# Patient Record
Sex: Male | Born: 1950 | Race: Black or African American | Hispanic: No | Smoking: Current some day smoker
Health system: Southern US, Community
[De-identification: ages and names within clinical notes are randomized; demographics above are authoritative.]

## PROBLEM LIST (undated history)

## (undated) DIAGNOSIS — G4700 Insomnia, unspecified: Secondary | ICD-10-CM

## (undated) DIAGNOSIS — E1149 Type 2 diabetes mellitus with other diabetic neurological complication: Secondary | ICD-10-CM

## (undated) DIAGNOSIS — I10 Essential (primary) hypertension: Secondary | ICD-10-CM

## (undated) DIAGNOSIS — N400 Enlarged prostate without lower urinary tract symptoms: Secondary | ICD-10-CM

## (undated) DIAGNOSIS — G5793 Unspecified mononeuropathy of bilateral lower limbs: Secondary | ICD-10-CM

## (undated) DIAGNOSIS — E785 Hyperlipidemia, unspecified: Secondary | ICD-10-CM

## (undated) DIAGNOSIS — K219 Gastro-esophageal reflux disease without esophagitis: Secondary | ICD-10-CM

## (undated) DIAGNOSIS — I2510 Atherosclerotic heart disease of native coronary artery without angina pectoris: Secondary | ICD-10-CM

## (undated) DIAGNOSIS — N183 Chronic kidney disease, stage 3 (moderate): Secondary | ICD-10-CM

## (undated) HISTORY — DX: Atherosclerotic heart disease of native coronary artery without angina pectoris: I25.10

## (undated) HISTORY — DX: Insomnia, unspecified: G47.00

## (undated) HISTORY — DX: Essential (primary) hypertension: I10

## (undated) HISTORY — DX: Chronic kidney disease, stage 3 (moderate): N18.3

## (undated) HISTORY — DX: Gastro-esophageal reflux disease without esophagitis: K21.9

## (undated) HISTORY — DX: Type 2 diabetes mellitus with other diabetic neurological complication: E11.49

## (undated) HISTORY — DX: Unspecified mononeuropathy of bilateral lower limbs: G57.93

## (undated) HISTORY — DX: Hyperlipidemia, unspecified: E78.5

## (undated) HISTORY — DX: Benign prostatic hyperplasia without lower urinary tract symptoms: N40.0

---

## 2009-04-18 ENCOUNTER — Encounter: Payer: Self-pay | Admitting: *Deleted

## 2009-04-18 ENCOUNTER — Inpatient Hospital Stay (HOSPITAL_COMMUNITY): Admission: EM | Admit: 2009-04-18 | Discharge: 2009-04-19 | Payer: Self-pay | Admitting: Emergency Medicine

## 2009-04-18 ENCOUNTER — Ambulatory Visit: Payer: Self-pay | Admitting: Internal Medicine

## 2009-04-18 LAB — CONVERTED CEMR LAB: Hgb A1c MFr Bld: 6 %

## 2009-04-19 ENCOUNTER — Encounter: Payer: Self-pay | Admitting: Internal Medicine

## 2009-04-29 DIAGNOSIS — R079 Chest pain, unspecified: Secondary | ICD-10-CM

## 2009-04-29 DIAGNOSIS — I10 Essential (primary) hypertension: Secondary | ICD-10-CM

## 2009-05-03 ENCOUNTER — Ambulatory Visit: Payer: Self-pay | Admitting: Cardiology

## 2009-05-03 ENCOUNTER — Ambulatory Visit: Payer: Self-pay | Admitting: Internal Medicine

## 2009-05-03 ENCOUNTER — Encounter: Payer: Self-pay | Admitting: Cardiology

## 2009-05-03 DIAGNOSIS — E1149 Type 2 diabetes mellitus with other diabetic neurological complication: Secondary | ICD-10-CM | POA: Insufficient documentation

## 2009-05-04 ENCOUNTER — Encounter: Payer: Self-pay | Admitting: Internal Medicine

## 2009-05-10 ENCOUNTER — Telehealth (INDEPENDENT_AMBULATORY_CARE_PROVIDER_SITE_OTHER): Payer: Self-pay | Admitting: Radiology

## 2009-05-11 ENCOUNTER — Encounter: Payer: Self-pay | Admitting: Cardiology

## 2009-05-11 ENCOUNTER — Ambulatory Visit: Payer: Self-pay

## 2009-05-12 ENCOUNTER — Ambulatory Visit: Payer: Self-pay | Admitting: Internal Medicine

## 2009-05-12 DIAGNOSIS — K219 Gastro-esophageal reflux disease without esophagitis: Secondary | ICD-10-CM

## 2009-05-16 ENCOUNTER — Ambulatory Visit: Payer: Self-pay | Admitting: Cardiology

## 2009-05-30 ENCOUNTER — Encounter: Payer: Self-pay | Admitting: *Deleted

## 2009-06-01 ENCOUNTER — Ambulatory Visit: Payer: Self-pay | Admitting: Internal Medicine

## 2009-06-01 DIAGNOSIS — G4700 Insomnia, unspecified: Secondary | ICD-10-CM

## 2009-06-01 LAB — CONVERTED CEMR LAB
Cholesterol: 146 mg/dL
HDL: 43 mg/dL
LDL Cholesterol: 78 mg/dL

## 2009-06-02 DIAGNOSIS — I2510 Atherosclerotic heart disease of native coronary artery without angina pectoris: Secondary | ICD-10-CM

## 2009-06-02 HISTORY — DX: Atherosclerotic heart disease of native coronary artery without angina pectoris: I25.10

## 2009-06-17 ENCOUNTER — Ambulatory Visit: Payer: Self-pay | Admitting: Cardiology

## 2009-06-17 ENCOUNTER — Encounter: Payer: Self-pay | Admitting: Cardiology

## 2009-06-17 ENCOUNTER — Encounter (INDEPENDENT_AMBULATORY_CARE_PROVIDER_SITE_OTHER): Payer: Self-pay | Admitting: *Deleted

## 2009-06-20 LAB — CONVERTED CEMR LAB
BUN: 9 mg/dL (ref 6–23)
Basophils Absolute: 0.1 10*3/uL (ref 0.0–0.1)
CO2: 28 meq/L (ref 19–32)
Eosinophils Absolute: 0.1 10*3/uL (ref 0.0–0.7)
Glucose, Bld: 104 mg/dL — ABNORMAL HIGH (ref 70–99)
HCT: 44.9 % (ref 39.0–52.0)
Hemoglobin: 15.1 g/dL (ref 13.0–17.0)
Lymphs Abs: 2.2 10*3/uL (ref 0.7–4.0)
MCHC: 33.7 g/dL (ref 30.0–36.0)
Monocytes Absolute: 0.6 10*3/uL (ref 0.1–1.0)
Neutro Abs: 5 10*3/uL (ref 1.4–7.7)
Platelets: 547 10*3/uL — ABNORMAL HIGH (ref 150.0–400.0)
Potassium: 4 meq/L (ref 3.5–5.1)
Prothrombin Time: 11.5 s (ref 10.9–13.3)
RDW: 12.6 % (ref 11.5–14.6)
Sodium: 141 meq/L (ref 135–145)

## 2009-06-24 ENCOUNTER — Ambulatory Visit: Payer: Self-pay | Admitting: Cardiovascular Disease

## 2009-06-24 ENCOUNTER — Inpatient Hospital Stay (HOSPITAL_COMMUNITY): Admission: RE | Admit: 2009-06-24 | Discharge: 2009-06-25 | Payer: Self-pay | Admitting: Cardiovascular Disease

## 2009-06-25 ENCOUNTER — Encounter: Payer: Self-pay | Admitting: Cardiovascular Disease

## 2009-07-01 ENCOUNTER — Ambulatory Visit: Payer: Self-pay | Admitting: Cardiology

## 2009-07-13 ENCOUNTER — Encounter: Payer: Self-pay | Admitting: Cardiovascular Disease

## 2009-08-01 ENCOUNTER — Ambulatory Visit: Payer: Self-pay | Admitting: Cardiology

## 2009-08-01 DIAGNOSIS — J449 Chronic obstructive pulmonary disease, unspecified: Secondary | ICD-10-CM | POA: Insufficient documentation

## 2009-08-29 ENCOUNTER — Ambulatory Visit: Payer: Self-pay | Admitting: Internal Medicine

## 2009-08-29 DIAGNOSIS — N529 Male erectile dysfunction, unspecified: Secondary | ICD-10-CM | POA: Insufficient documentation

## 2009-08-29 LAB — CONVERTED CEMR LAB: Blood Glucose, Fingerstick: 102

## 2009-09-12 ENCOUNTER — Ambulatory Visit: Payer: Self-pay | Admitting: Cardiology

## 2009-09-14 ENCOUNTER — Encounter (INDEPENDENT_AMBULATORY_CARE_PROVIDER_SITE_OTHER): Payer: Self-pay | Admitting: *Deleted

## 2009-09-14 LAB — CONVERTED CEMR LAB
ALT: 18 units/L (ref 0–53)
Bilirubin, Direct: 0.1 mg/dL (ref 0.0–0.3)
HDL: 43.9 mg/dL (ref >39.00)
LDL Cholesterol: 71 mg/dL (ref 0–99)
Total Bilirubin: 0.8 mg/dL (ref 0.3–1.2)
Total CHOL/HDL Ratio: 3
VLDL: 14 mg/dL (ref 0.0–40.0)

## 2009-09-21 ENCOUNTER — Telehealth: Payer: Self-pay | Admitting: Internal Medicine

## 2009-09-21 ENCOUNTER — Encounter: Payer: Self-pay | Admitting: Internal Medicine

## 2009-10-10 ENCOUNTER — Telehealth: Payer: Self-pay | Admitting: Internal Medicine

## 2009-11-02 ENCOUNTER — Ambulatory Visit: Payer: Self-pay | Admitting: Internal Medicine

## 2009-11-02 LAB — CONVERTED CEMR LAB: Blood Glucose, Fingerstick: 130

## 2010-02-07 ENCOUNTER — Encounter: Payer: Self-pay | Admitting: Internal Medicine

## 2010-02-07 LAB — HM DIABETES EYE EXAM

## 2010-02-14 ENCOUNTER — Ambulatory Visit: Payer: Self-pay | Admitting: Internal Medicine

## 2010-02-14 LAB — CONVERTED CEMR LAB: Hgb A1c MFr Bld: 7.1 %

## 2010-03-01 ENCOUNTER — Encounter: Payer: Self-pay | Admitting: Internal Medicine

## 2010-04-06 ENCOUNTER — Ambulatory Visit: Payer: Self-pay | Admitting: Cardiology

## 2010-04-06 DIAGNOSIS — E785 Hyperlipidemia, unspecified: Secondary | ICD-10-CM | POA: Insufficient documentation

## 2010-04-11 LAB — CONVERTED CEMR LAB
ALT: 17 units/L (ref 0–53)
AST: 20 units/L (ref 0–37)
Albumin: 4.2 g/dL (ref 3.5–5.2)
Alkaline Phosphatase: 58 units/L (ref 39–117)
Bilirubin, Direct: 0.1 mg/dL (ref 0.0–0.3)
Cholesterol: 178 mg/dL (ref 0–200)
HDL: 51.7 mg/dL (ref >39.00)
LDL Cholesterol: 107 mg/dL — ABNORMAL HIGH (ref 0–99)
Total Bilirubin: 0.7 mg/dL (ref 0.3–1.2)
Total CHOL/HDL Ratio: 3
Total Protein: 7.9 g/dL (ref 6.0–8.3)
Triglycerides: 98 mg/dL (ref 0.0–149.0)
VLDL: 19.6 mg/dL (ref 0.0–40.0)

## 2010-05-19 ENCOUNTER — Emergency Department (HOSPITAL_COMMUNITY): Admission: EM | Admit: 2010-05-19 | Discharge: 2010-05-19 | Payer: Self-pay | Admitting: Emergency Medicine

## 2010-06-08 ENCOUNTER — Ambulatory Visit: Payer: Self-pay | Admitting: Internal Medicine

## 2010-06-08 ENCOUNTER — Telehealth: Payer: Self-pay | Admitting: Internal Medicine

## 2010-06-08 LAB — CONVERTED CEMR LAB: Hgb A1c MFr Bld: 6.2 %

## 2010-08-01 ENCOUNTER — Ambulatory Visit: Payer: Self-pay | Admitting: Internal Medicine

## 2010-08-01 DIAGNOSIS — M722 Plantar fascial fibromatosis: Secondary | ICD-10-CM

## 2010-08-01 DIAGNOSIS — N400 Enlarged prostate without lower urinary tract symptoms: Secondary | ICD-10-CM

## 2010-08-04 ENCOUNTER — Telehealth: Payer: Self-pay | Admitting: Internal Medicine

## 2010-08-15 ENCOUNTER — Ambulatory Visit: Payer: Self-pay | Admitting: Internal Medicine

## 2010-09-05 ENCOUNTER — Telehealth: Payer: Self-pay | Admitting: Internal Medicine

## 2010-09-21 IMAGING — CT CT ABDOMEN WO/W CM
2 of 11 series · 12 of 46 positions shown, 18 images · IV contrast (agent unspecified)
Comparison: Chest radiograph of 04/18/2009

CT ABDOMEN

CLINICAL DATA: Epigastric pain extending into the chest.  Nausea.
20 pounds weight loss in the past year.  Diabetes.  Hypertension.

CT ABDOMEN WITHOUT AND WITH CONTRAST
CT PELVIS WITH CONTRAST
TECHNIQUE: Multidetector CT imaging of the abdomen was performed
initially following the standard protocol before administration of
intravenous contrast.  Multidetector CT imaging of the abdomen and
pelvis was then performed following the standard protocol during
the bolus injection of intravenous contrast.
Contrast: 125 ml [id number]-[id number]

[Series 5: renal nephrographic · axial · 0.70mm/px · z∈[+760,+1150]mm · 10 of 157 slices shown, 15 images]
[im 14/157  soft-tissue]
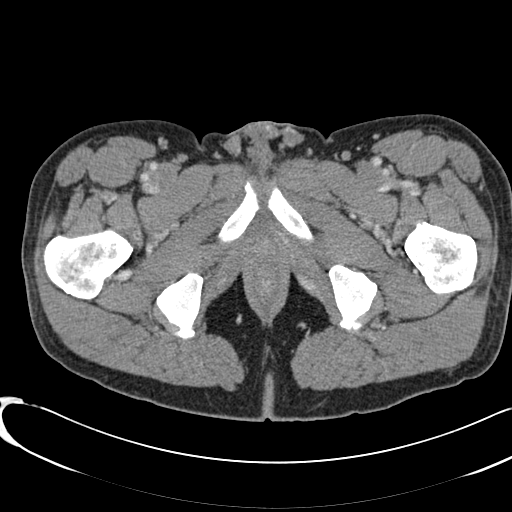
[im 14/157  bone]
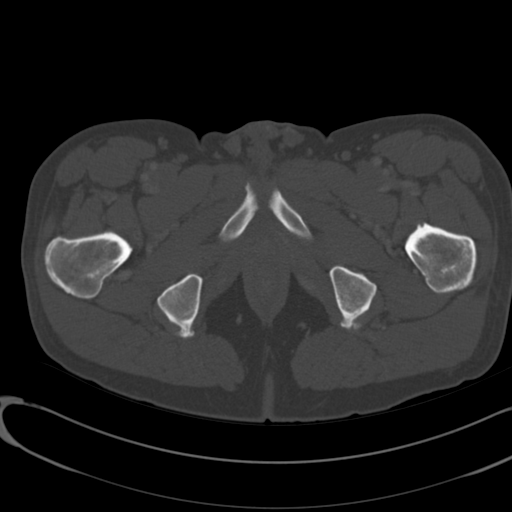
[im 27/157  soft-tissue]
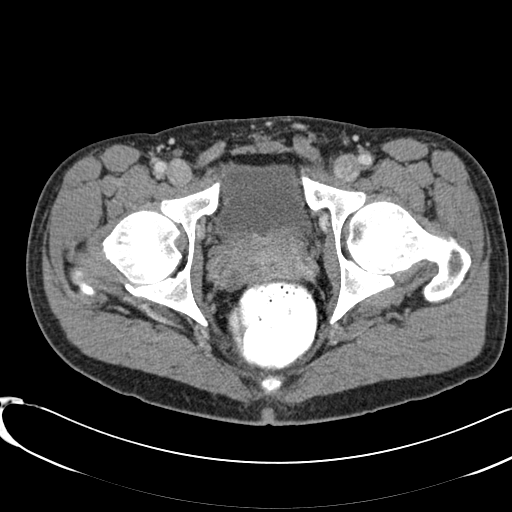
[im 53/157  soft-tissue]
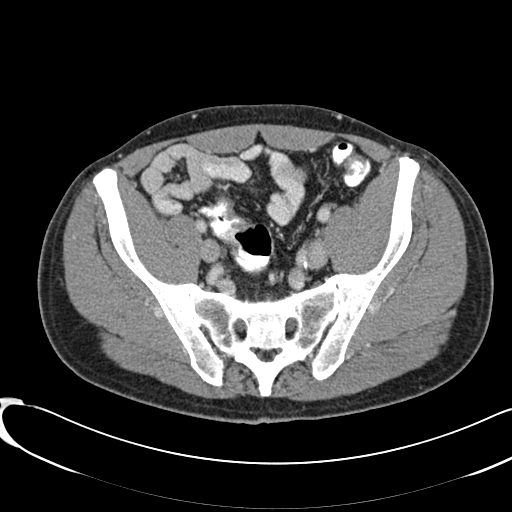
[im 66/157  soft-tissue]
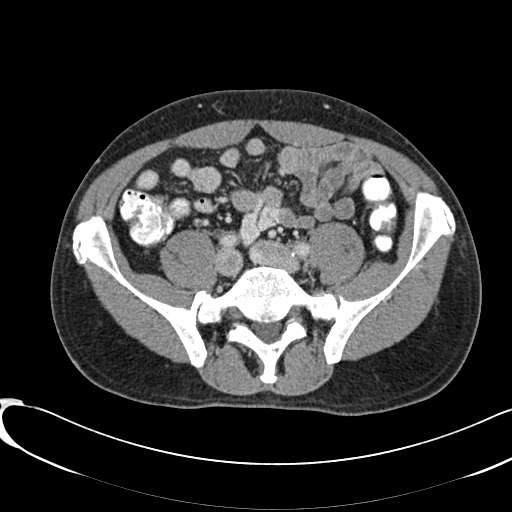
[im 79/157  soft-tissue]
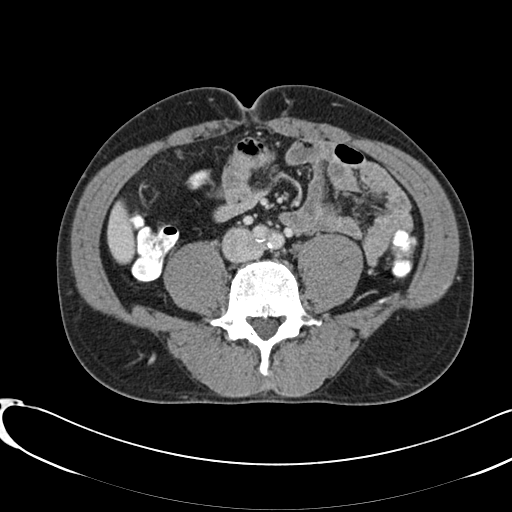
[im 92/157  soft-tissue]
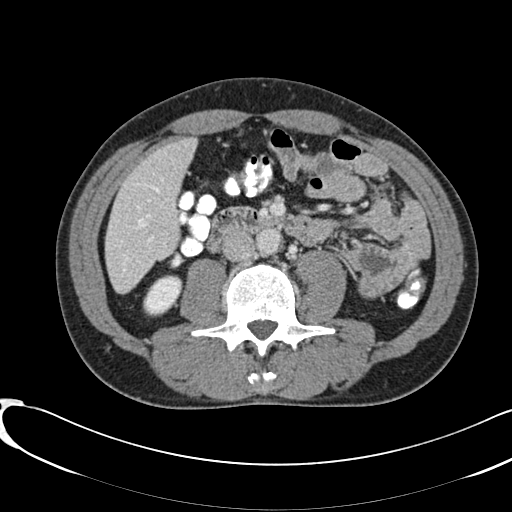
[im 105/157  soft-tissue]
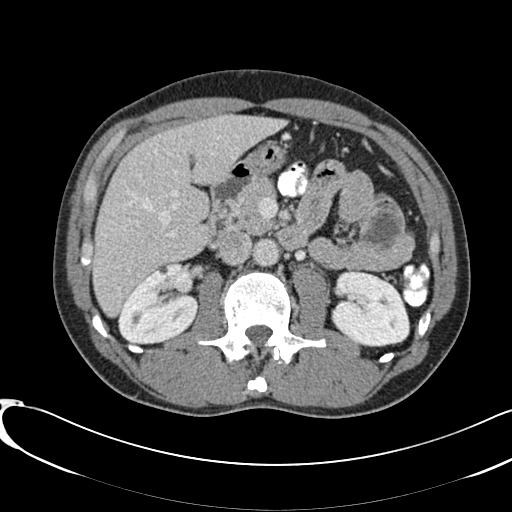
[im 105/157  lung]
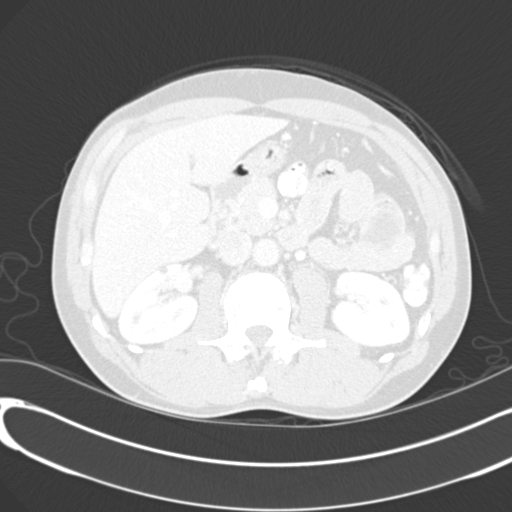
[im 118/157  lung]
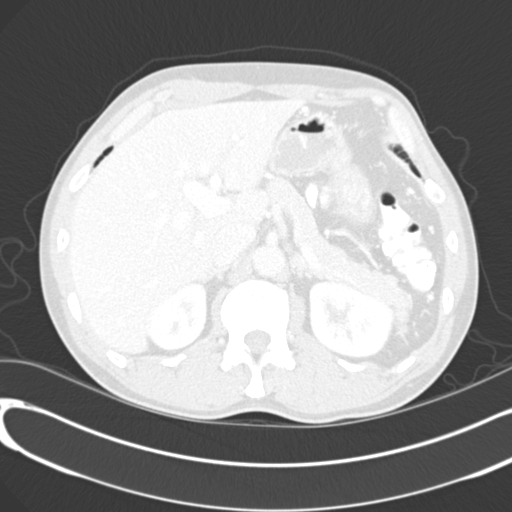
[im 131/157  soft-tissue]
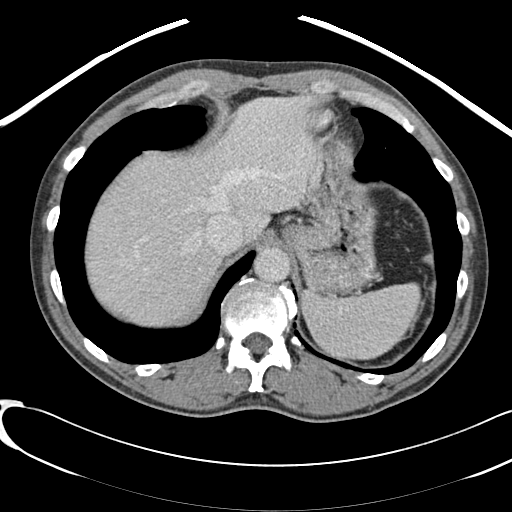
[im 131/157  lung]
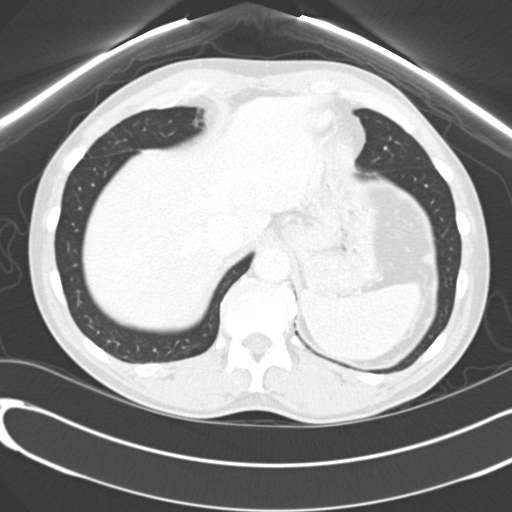
[im 144/157  soft-tissue]
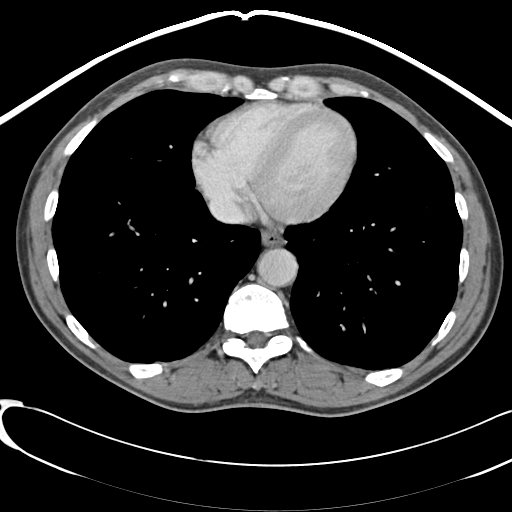
[im 144/157  lung]
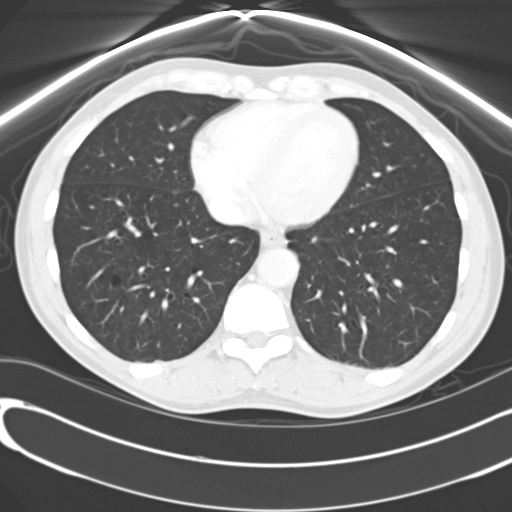
[im 144/157  bone]
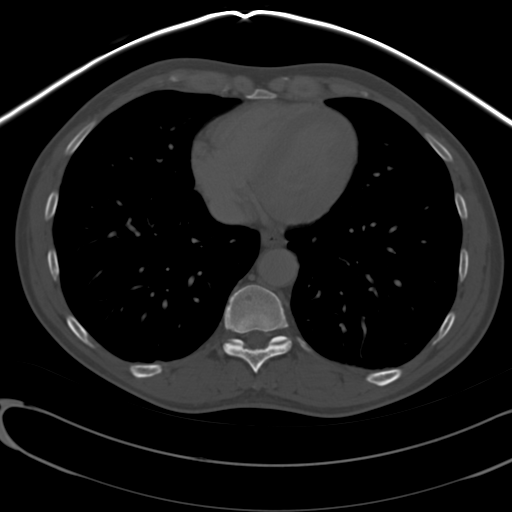

[Series 602: <mpr thick range> · coronal · 0.70mm/px · 2 of 114 slices shown, 3 images]
[im 38/114  soft-tissue]
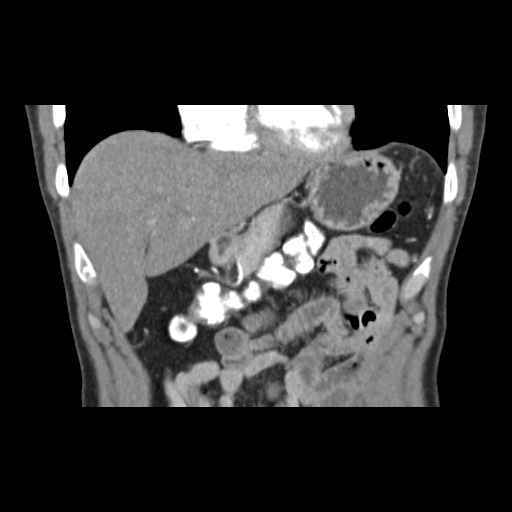
[im 38/114  bone]
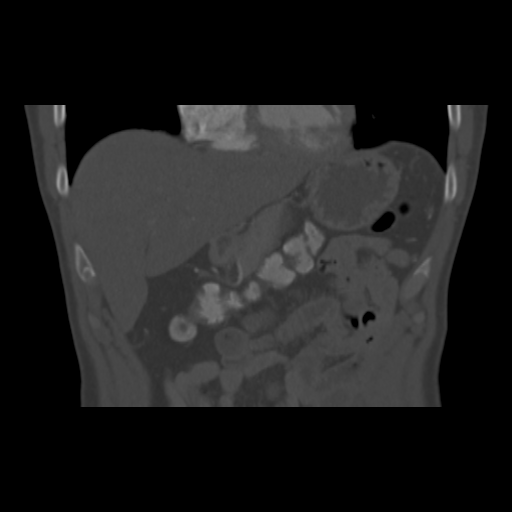
[im 76/114  soft-tissue]
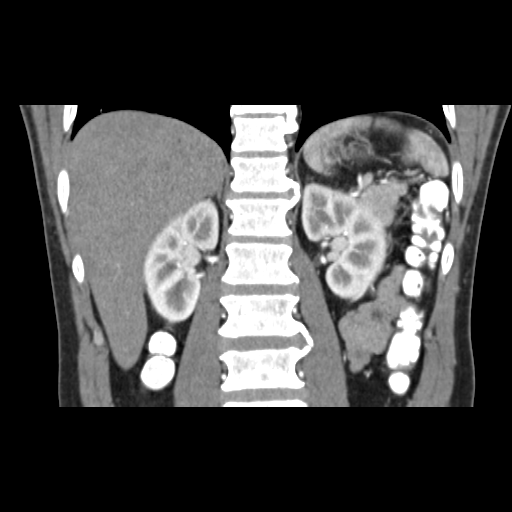

[12 of 46 positions shown; findings below may reference images not displayed]

FINDINGS: Images of the lung bases demonstrate paraseptal
emphysema.

The noncontrast images demonstrate no renal calculi or pancreatic
calcifications.  No pancreatic duct dilatation is identified.  No
differential enhancement is identified in the pancreas on arterial
or portal venous phase images to suggest pancreatic carcinoma.  No
peripancreatic stranding is noted.

The adrenal glands, spleen, and duodenum appear unremarkable.
There is a small focus of arterial phase enhancement in the right
hepatic lobe on image 56 of series #4 measuring 3 mm in diameter,
suspicious for flash filling of a hemangioma.  Based on size this
lesion is technically too small to characterize.

No pathologic retroperitoneal or porta hepatis adenopathy is
identified.

A 5 mm hypodense right renal lesion is highly likely to represent a
cyst.
IMPRESSION: 1.  Paraseptal emphysema.
2.  The pancreas appears unremarkable.
3.  Small nonspecific focus of arterial phase enhancement
inferiorly in the right hepatic lobe probably represents flash
filling of a hemangioma but is technically nonspecific due to the
small size (3 mm) of the lesion.

CT PELVIS
FINDINGS: There is atherosclerosis of the common iliac arteries,
with some mural thrombus and potentially a small focal dissection
in the proximal portion of the left common iliac artery.

No definite distal ureteral calculus is identified.  The urinary
bladder appears unremarkable.

There is borderline prominence of the seminal vesicles.  Several
sigmoid diverticula are present without evidence of active
diverticulitis.

Small bilateral inguinal lymph nodes are present. No pathologic
pelvic adenopathy is identified.

Bridging spurring of the sacroiliac joints is noted.
IMPRESSION: 1.  Bridging spurring of the sacroiliac joints.
2.  Scattered colonic diverticula, but without evidence of active
diverticulitis.
3.  Atherosclerosis of the common iliac arteries, potentially with
a small focal dissection of the left proximal common iliac artery.

## 2010-10-09 ENCOUNTER — Telehealth: Payer: Self-pay | Admitting: Internal Medicine

## 2010-10-11 ENCOUNTER — Ambulatory Visit: Payer: Self-pay | Admitting: Cardiology

## 2010-11-13 ENCOUNTER — Telehealth: Payer: Self-pay | Admitting: Internal Medicine

## 2010-11-23 ENCOUNTER — Ambulatory Visit: Payer: Self-pay | Admitting: Cardiology

## 2010-11-29 ENCOUNTER — Encounter (INDEPENDENT_AMBULATORY_CARE_PROVIDER_SITE_OTHER): Payer: Self-pay | Admitting: *Deleted

## 2010-11-29 LAB — CONVERTED CEMR LAB
ALT: 20 units/L (ref 0–53)
AST: 20 units/L (ref 0–37)
Albumin: 3.4 g/dL — ABNORMAL LOW (ref 3.5–5.2)
HDL: 44.8 mg/dL (ref >39.00)
Total CHOL/HDL Ratio: 2
Total Protein: 6.2 g/dL (ref 6.0–8.3)
Triglycerides: 67 mg/dL (ref 0.0–149.0)

## 2011-01-02 NOTE — Progress Notes (Signed)
Summary: refill/gg  Phone Note Refill Request  on September 05, 2010 10:18 AM  Refills Requested: Medication #1:  METFORMIN HCL 500 MG TABS Take 1 tablet by mouth in AM and two in PM once a day   Last Refilled: 08/29/2010 refill is for 1 a day   Method Requested: Electronic Initial call taken by: Merrie Roof RN,  September 05, 2010 10:22 AM  Follow-up for Phone Call        I cannot find where pt was instructed to take metformin only once a day.  Is that what they are taking and why? Follow-up by: Mariea Stable MD,  September 07, 2010 4:37 PM  Additional Follow-up for Phone Call Additional follow up Details #1::        Pt called and he takes Metformin 500 mg tow tabs in the AM.  He states his CBG's are about 120  Can you please refill? Additional Follow-up by: Merrie Roof RN,  September 08, 2010 5:39 PM    Prescriptions: METFORMIN HCL 500 MG TABS (METFORMIN HCL) Take 1 tablet by mouth in AM and two in PM once a day  #90 x 3   Entered and Authorized by:   Clerance Lav MD   Signed by:   Clerance Lav MD on 09/08/2010   Method used:   Electronically to        [facility] (retail)       [address]       [place], [place]  [address]       Ph: [phone]       Fax: [phone]   RxID:   [id number]

## 2011-01-02 NOTE — Assessment & Plan Note (Signed)
Summary: FU/CHECKUP/SB.   Vital Signs:  Patient profile:   59 year old male Height:      73 inches (185.42 cm) Weight:      185.5 pounds (84.32 kg) BMI:     24.56 Temp:     97.4 degrees F (36.33 degrees C) oral Pulse rate:   62 / minute BP sitting:   147 / 97  (right arm) Cuff size:   regular  Vitals Entered By: Theotis Barrio NT II (August 01, 2010 2:23 PM) CC: FEET PAIN , WITH NUMBNESS  STARTED ABOUT 2 MONTHS AGO, Depression Is Patient Diabetic? Yes Did you bring your meter with you today? METER DON'T DOWNLOAD Pain Assessment Patient in pain? yes     Location: FEET Intensity:        8 Type: numbness Onset of pain  ABOUT 2 MONTHS AGO Nutritional Status BMI of 19 -24 = normal  Does patient need assistance? Functional Status Self care Ambulation Normal    Primary Care Provider:  Clerance Lav MD  CC:  FEET PAIN , WITH NUMBNESS  STARTED ABOUT 2 MONTHS AGO, and Depression.  History of Present Illness: Patient is 60 year old man with recent history of fall. He has past history of CAD.  He is complaining of new onset of pain at the base of foot on left side. his pain is more prominent when he applies weight on the foot while walking.  It is more severe in early morning.  It is now associated with tingling and nubmness in the left toes.   He denies any direct injury but agrees that when he hurt his back he might have overused it.  He denies any fevers, nausea, vomiting.  He also is having problem with frequent urination at the night time. He has difficulty initiating the urination. He denies pain, urgency and frequency during day time.   Depression History:      The patient is having a depressed mood most of the day but denies diminished interest in his usual daily activities.        Suicide risk questions reveal that he does not feel like life is worth living.  The patient denies that he wishes that he were dead and denies that he has thought about ending his life.        Preventive Screening-Counseling & Management  Alcohol-Tobacco     Smoking Status: current     Smoking Cessation Counseling: yes     Packs/Day: <0.25  Caffeine-Diet-Exercise     Does Patient Exercise: yes     Exercise (avg: min/session): 8:01  Current Medications (verified): 1)  Metformin Hcl 500 Mg Tabs (Metformin Hcl) .... Take 1 Tablet By Mouth in Am and Two in Pm Once A Day 2)  Nitrostat 0.4 Mg Subl (Nitroglycerin) .... Once Every 5 Minutes As Needed For Chest Pain 3)  Metoprolol Succinate 25 Mg Xr24h-Tab (Metoprolol Succinate) .... Take One Tablet By Mouth Daily 4)  Multivitamins   Tabs (Multiple Vitamin) .... One Tablet By Mouth Once Daily 5)  Aspirin Ec 325 Mg Tbec (Aspirin) .... Take One Tablet By Mouth Daily 6)  Protonix 40 Mg Tbec (Pantoprazole Sodium) .... Take 1 Tablet By Mouth Once A Day 7)  Crestor 40 Mg Tabs (Rosuvastatin Calcium) .... Take One Tablet By Mouth Daily. 8)  Lunesta 2 Mg Tabs (Eszopiclone) .... Take 1 Tab By Mouth At Bedtime 9)  Trazodone Hcl 50 Mg Tabs (Trazodone Hcl) .... Ran Out Take 1 Tablet By Mouth Three  Times A Day 10)  Acetaminophen 500 Mg Tabs (Acetaminophen) .... Take 1 Tablet By Mouth Four Times A Day 11)  Cyclobenzaprine Hcl 10 Mg Tabs (Cyclobenzaprine Hcl) .... Take 1/2 To 1 Tab 3 Times A Day As Needed For Back Pain. 12)  Celebrex 200 Mg Cap (Celecoxib) .... Take One (1) Tablet By Mouth Two (2) Times A Day With Food 13)  Flomax 0.4 Mg Caps (Tamsulosin Hcl) .... Once Daily  Allergies (verified): No Known Drug Allergies  Past History:  Past Medical History: Last updated: 04/06/2010 hyperlipidemia DM since 2007 HYPERTENSION (ICD-401.9) coronary artery disease Abnormal chest x-ray  Past Surgical History: Last updated: 05/12/2009 Unremarkable  Family History: Last updated: 05/12/2009 Sister with MI at age 53 Father died of ulcer Mother with DM No FH of Colon Cancer:  Social History: Last updated: 05/12/2009 Disabled  Single   Tobacco Use - Yes.  Alcohol Use - yes Illicit Drug Use - no Patient gets regular exercise. Daily Caffeine Use: occ  Risk Factors: Exercise: yes (08/01/2010)  Risk Factors: Smoking Status: current (08/01/2010) Packs/Day: <0.25 (08/01/2010)  Review of Systems      See HPI  Physical Exam  General:  Well developed, well nourished, no acute distress. Head:  Normocephalic and atraumatic. Eyes:  PERRLA, no icterus. Ears:  Normal auditory acuity. Nose:  No deformity, discharge,  or lesions. Mouth:  No deformity or lesions, dentition normal. Lungs:  Clear throughout to auscultation. Heart:  Regular rate and rhythm; no murmurs, rubs,  or bruits. Abdomen:  Soft, nontender and nondistended. No masses, hepatosplenomegaly or hernias noted. Normal bowel sounds. Msk:  Symmetrical with no gross deformities. Normal posture. Extremities:  tenderness at the arch of left foot. no other gross abrnomality.  Neurologic:  No cranial nerve deficits noted. Station and gait are normal. Plantar reflexes are down-going bilaterally. DTRs are symmetrical throughout. Sensory, motor and coordinative functions appear intact. Psych:  Cognition and judgment appear intact. Alert and cooperative with normal attention span and concentration. No apparent delusions, illusions, hallucinations   Impression & Recommendations:  Problem # 1:  LOW BACK PAIN, ACUTE (ICD-724.2) his back pain has now resolved.  His updated medication list for this problem includes:    Aspirin Ec 325 Mg Tbec (Aspirin) .Marland Kitchen... Take one tablet by mouth daily    Acetaminophen 500 Mg Tabs (Acetaminophen) .Marland Kitchen... Take 1 tablet by mouth four times a day    Cyclobenzaprine Hcl 10 Mg Tabs (Cyclobenzaprine hcl) .Marland Kitchen... Take 1/2 to 1 tab 3 times a day as needed for back pain.    Celebrex 200 Mg Cap (Celecoxib) .Marland Kitchen... Take one (1) tablet by mouth two (2) times a day with food  Problem # 2:  HYPERCHOLESTEROLEMIA (ICD-272.0) continue crestor, LDL is above  target, retest soon. He is followed by cardiolgist.  His updated medication list for this problem includes:    Crestor 40 Mg Tabs (Rosuvastatin calcium) .Marland Kitchen... Take one tablet by mouth daily.  Labs Reviewed: SGOT: 20 (04/06/2010)   SGPT: 17 (04/06/2010)   HDL:51.70 (04/06/2010), 43.90 (09/12/2009)  LDL:107 (04/06/2010), 71 (82/95/6213)  Chol:178 (04/06/2010), 129 (09/12/2009)  Trig:98.0 (04/06/2010), 70.0 (09/12/2009)  Problem # 3:  TOBACCO ABUSE (ICD-305.1)  Encouraged smoking cessation and discussed different methods for smoking cessation.   Problem # 4:  CAD (ICD-414.00) He has bare metal stent so he is not on plavix. Continued beta blocker with aspirin.  His updated medication list for this problem includes:    Nitrostat 0.4 Mg Subl (Nitroglycerin) ..... Once every 5 minutes  as needed for chest pain    Metoprolol Succinate 25 Mg Xr24h-tab (Metoprolol succinate) .Marland Kitchen... Take one tablet by mouth daily    Aspirin Ec 325 Mg Tbec (Aspirin) .Marland Kitchen... Take one tablet by mouth daily  Problem # 5:  DM (ICD-250.00) A1c is better controlled. He is not on ACE inhibitor as he did not tolerate it. I will add microalbumin/creatinine ratio on next blood work.  His updated medication list for this problem includes:    Metformin Hcl 500 Mg Tabs (Metformin hcl) .Marland Kitchen... Take 1 tablet by mouth in am and two in pm once a day    Aspirin Ec 325 Mg Tbec (Aspirin) .Marland Kitchen... Take one tablet by mouth daily  Labs Reviewed: Creat: 1.3 (06/17/2009)     Last Eye Exam: No diabetic retinopathy.    (02/07/2010) Reviewed HgBA1c results: 6.2 (06/08/2010)  7.1 (02/14/2010)  Problem # 6:  EPIGASTRIC PAIN (ICD-789.06)  stable.   Discussed symptom control with the patient.   Problem # 7:  PLANTAR FASCIITIS, LEFT (ICD-728.71) His foot pain is likely related to plantar fascitis. I showed him exercises and instructed him on ice use. He might have overuse strain on the leg after back injury.  I will give him NSAID and ask him to  follow in 2 weeks.   His updated medication list for this problem includes:    Celebrex 200 Mg Cap (Celecoxib) .Marland Kitchen... Take one (1) tablet by mouth two (2) times a day with food  Problem # 8:  BENIGN PROSTATIC HYPERTROPHY, HX OF (ICD-V13.8) His night time frequency is likely related to BPH, will start him on Flomax.   Problem # 9:  HYPERTENSION (ICD-401.9) did not tolerate ACE and had cough. may need to start ARB.  His updated medication list for this problem includes:    Metoprolol Succinate 25 Mg Xr24h-tab (Metoprolol succinate) .Marland Kitchen... Take one tablet by mouth daily  BP today: 147/97 Prior BP: 141/95 (06/08/2010)  Labs Reviewed: K+: 4.0 (06/17/2009) Creat: : 1.3 (06/17/2009)   Chol: 178 (04/06/2010)   HDL: 51.70 (04/06/2010)   LDL: 107 (04/06/2010)   TG: 98.0 (04/06/2010)  Complete Medication List: 1)  Metformin Hcl 500 Mg Tabs (Metformin hcl) .... Take 1 tablet by mouth in am and two in pm once a day 2)  Nitrostat 0.4 Mg Subl (Nitroglycerin) .... Once every 5 minutes as needed for chest pain 3)  Metoprolol Succinate 25 Mg Xr24h-tab (Metoprolol succinate) .... Take one tablet by mouth daily 4)  Multivitamins Tabs (Multiple vitamin) .... One tablet by mouth once daily 5)  Aspirin Ec 325 Mg Tbec (Aspirin) .... Take one tablet by mouth daily 6)  Protonix 40 Mg Tbec (Pantoprazole sodium) .... Take 1 tablet by mouth once a day 7)  Crestor 40 Mg Tabs (Rosuvastatin calcium) .... Take one tablet by mouth daily. 8)  Lunesta 2 Mg Tabs (Eszopiclone) .... Take 1 tab by mouth at bedtime 9)  Trazodone Hcl 50 Mg Tabs (Trazodone hcl) .... Ran out take 1 tablet by mouth three times a day 10)  Acetaminophen 500 Mg Tabs (Acetaminophen) .... Take 1 tablet by mouth four times a day 11)  Cyclobenzaprine Hcl 10 Mg Tabs (Cyclobenzaprine hcl) .... Take 1/2 to 1 tab 3 times a day as needed for back pain. 12)  Celebrex 200 Mg Cap (Celecoxib) .... Take one (1) tablet by mouth two (2) times a day with food 13)   Flomax 0.4 Mg Caps (Tamsulosin hcl) .... Once daily  Patient Instructions: 1)  You have  inflammation of ligaments in the leg. It needs to be iced twice to three times a day for next 2 weeks.  2)  Please schedule a follow-up appointment in 2 weeks. Prescriptions: FLOMAX 0.4 MG CAPS (TAMSULOSIN HCL) once daily  #30 x 3   Entered and Authorized by:   Clerance Lav MD   Signed by:   Clerance Lav MD on 08/01/2010   Method used:   Electronically to        [facility] (retail)       [address]       [place], [place]  [address]       Ph: [phone]       Fax: [phone]   RxID:   [id number] CELEBREX 200 MG CAP (CELECOXIB) Take one (1) tablet by mouth two (2) times a day with food  #30 x 0   Entered and Authorized by:   Clerance Lav MD   Signed by:   Clerance Lav MD on 08/01/2010   Method used:   Electronically to        [facility] (retail)       [address]       [place], [place]  [address]       Ph: [phone]       Fax: [phone]   RxID:   [id number]    Prevention & Chronic Care Immunizations   Influenza vaccine: Not documented   Influenza vaccine deferral: Refused  (02/14/2010)    Tetanus booster: 06/01/2009: Td   Tetanus booster due: 06/02/2019    Pneumococcal vaccine: Pneumovax  (08/29/2009)   Pneumococcal vaccine deferral: Deferred  (08/01/2010)    H. zoster vaccine: Not documented   H. zoster vaccine deferral: Deferred  (11/02/2009)  Colorectal Screening   Hemoccult: Not documented   Hemoccult action/deferral: Ordered  (08/29/2009)    Colonoscopy: Not documented   Colonoscopy action/deferral: Deferred  (06/01/2009)  Other Screening   PSA: Not documented   PSA action/deferral: Discussed-PSA declined  (06/01/2009)   Smoking status: current  (08/01/2010)   Smoking cessation counseling: yes  (08/01/2010)  Diabetes Mellitus   HgbA1C: 6.2  (06/08/2010)   HgbA1C action/deferral:  Ordered  (08/29/2009)   Hemoglobin A1C due: 07/19/2009    Eye exam: No diabetic retinopathy.     (02/07/2010)   Diabetic eye exam action/deferral: no diabetic retinopathy  (02/16/2010)   Eye exam due: 02/2011    Foot exam: Not documented   Foot exam action/deferral: Do today   High risk foot: No  (06/01/2009)   Foot care education: Done  (06/01/2009)   Foot exam due: 05/03/2010    Urine microalbumin/creatinine ratio: Not documented   Urine microalbumin action/deferral: Ordered    Diabetes flowsheet reviewed?: Yes   Progress toward A1C goal: Deteriorated  Lipids   Total Cholesterol: 178  (04/06/2010)   LDL: 107  (04/06/2010)   LDL Direct: Not documented   HDL: 51.70  (04/06/2010)   Triglycerides: 98.0  (04/06/2010)   Lipid panel due: 06/01/2010    SGOT (AST): 20  (04/06/2010)   SGPT (ALT): 17  (04/06/2010)   Alkaline phosphatase: 58  (04/06/2010)   Total bilirubin: 0.7  (04/06/2010)  Hypertension   Last Blood Pressure: 147 / 97  (08/01/2010)   Serum creatinine: 1.3  (06/17/2009)   Serum potassium 4.0  (06/17/2009)   Basic metabolic panel due: 06/01/2010  Self-Management Support :  Personal Goals (by the next clinic visit) :     Personal A1C goal: 7  (11/02/2009)     Personal blood pressure goal: 140/90  (11/02/2009)     Personal LDL goal: 100  (06/01/2009)    Patient will work on the following items until the next clinic visit to reach self-care goals:     Medications and monitoring: take my medicines every day, check my blood sugar, examine my feet every day  (08/01/2010)     Eating: drink diet soda or water instead of juice or soda, eat more vegetables, use fresh or frozen vegetables, eat foods that are low in salt, eat baked foods instead of fried foods, eat fruit for snacks and desserts, limit or avoid alcohol  (08/01/2010)     Activity: take a 30 minute walk every day  (08/01/2010)     Other: 1/4 mile walk on cooler days  (08/29/2009)    Diabetes  self-management support: Resources for patients handout  (08/01/2010)    Hypertension self-management support: Resources for patients handout  (08/01/2010)    Lipid self-management support: Resources for patients handout  (08/01/2010)     Self-management comments: STILL DO WALKING WHEN ABLE DUE TO BACK PAIN      Resource handout printed.   Nursing Instructions: Give Flu vaccine today Diabetic foot exam today

## 2011-01-02 NOTE — Assessment & Plan Note (Signed)
Summary: ACUTE-LEG AND BACK PAIN ([facility])/CFB   Vital Signs:  Patient profile:   60 year old Yu Height:      73 inches (185.Timothy cm) Weight:      185.01 pounds (84.10 kg) BMI:     24.50 Temp:     98.7 degrees F (37.06 degrees C) oral Pulse rate:   97 / minute BP sitting:   141 / 95  (right arm)  Vitals Entered By: Angelina Ok RN (June 08, 2010 2:13 PM) CC: Depression Is Patient Diabetic? Yes Did you bring your meter with you today? Yes Pain Assessment Patient in pain? yes     Location: lower back, left leg Intensity: 10 Type: throbbing Onset of pain  Constant Nutritional Status BMI of 19 -24 = normal CBG Result 186  Have you ever been in a relationship where you felt threatened, hurt or afraid?No   Does patient need assistance? Functional Status Cook/clean Comments Larey Seat about 1 1/2 weeks ago.  Got a shot in the ED that helped.  Pain is returning.   Primary Care Provider:  Clerance Lav MD  CC:  Depression.  History of Present Illness: Patient is 60 year old man who fell 10 days ago and has been having increased pain in his back and left leg sinc ethat time.  Patient was given a shot in ED which made him feel better. The pain 10/10 at its worse, present on the left side of his lower back and radiating to the back of his leg towards his foot. Aggravted by walking and bending and relieved by nothing. He has been quite active and doing alot of walking as told by the nurse in ED. No change in bowel or urinary habits, no weakness or numbness. he is definately having trouble sleeping as the pain is some think which has been progressively getting worse.  No other complaints.  Depression History:      The patient is having a depressed mood most of the day and has a diminished interest in his usual daily activities.        The patient denies that he feels like life is not worth living, denies that he wishes that he were dead, and denies that he has thought about ending his life.          Preventive Screening-Counseling & Management  Alcohol-Tobacco     Smoking Status: current     Smoking Cessation Counseling: yes     Packs/Day: <0.25  Comments: Trying to quit.  Problems Prior to Update: 1)  Hypercholesterolemia  (ICD-272.0) 2)  Erectile Dysfunction, Organic  (ICD-607.84) 3)  Encounter For Long-term Use of Other Medications  (ICD-V58.69) 4)  Tobacco Abuse  (ICD-305.1) 5)  Abnormal Chest Xray  (ICD-793.1) 6)  Cad  (ICD-414.00) 7)  Pneumococcal Pneumonia  (ICD-481) 8)  Insomnia Unspecified  (ICD-780.52) 9)  Depression  (ICD-311) 10)  Screening Colorectal-cancer  (ICD-V76.51) 11)  Diarrhea  (ICD-787.91) 12)  Weight Loss-abnormal  (ICD-783.21) 13)  Gerd  (ICD-530.81) 14)  Dm  (ICD-250.00) 15)  Hypertension  (ICD-401.9) 16)  Chest Pain  (ICD-786.50) 17)  Epigastric Pain  (ICD-789.06)  Medications Prior to Update: 1)  Metformin Hcl 500 Mg Tabs (Metformin Hcl) .... Take 1 Tablet By Mouth Once A Day 2)  Nitrostat 0.4 Mg Subl (Nitroglycerin) .... Once Every 5 Minutes As Needed For Chest Pain 3)  Metoprolol Succinate 25 Mg Xr24h-Tab (Metoprolol Succinate) .... Take One Tablet By Mouth Daily 4)  Multivitamins   Tabs (Multiple  Vitamin) .... One Tablet By Mouth Once Daily 5)  Aspirin Ec 325 Mg Tbec (Aspirin) .... Take One Tablet By Mouth Daily 6)  Protonix 40 Mg Tbec (Pantoprazole Sodium) .... Take 1 Tablet By Mouth Once A Day 7)  Crestor 40 Mg Tabs (Rosuvastatin Calcium) .... Take One Tablet By Mouth Daily. 8)  Lunesta 2 Mg Tabs (Eszopiclone) .... Take 1 Tab By Mouth At Bedtime 9)  Trazodone Hcl 50 Mg Tabs (Trazodone Hcl) .... Ran Out Take 1 Tablet By Mouth Three Times A Day  Current Medications (verified): 1)  Metformin Hcl 500 Mg Tabs (Metformin Hcl) .... Take 1 Tablet By Mouth Once A Day 2)  Nitrostat 0.4 Mg Subl (Nitroglycerin) .... Once Every 5 Minutes As Needed For Chest Pain 3)  Metoprolol Succinate 25 Mg Xr24h-Tab (Metoprolol Succinate) .... Take One  Tablet By Mouth Daily 4)  Multivitamins   Tabs (Multiple Vitamin) .... One Tablet By Mouth Once Daily 5)  Aspirin Ec 325 Mg Tbec (Aspirin) .... Take One Tablet By Mouth Daily 6)  Protonix 40 Mg Tbec (Pantoprazole Sodium) .... Take 1 Tablet By Mouth Once A Day 7)  Crestor 40 Mg Tabs (Rosuvastatin Calcium) .... Take One Tablet By Mouth Daily. 8)  Lunesta 2 Mg Tabs (Eszopiclone) .... Take 1 Tab By Mouth At Bedtime 9)  Trazodone Hcl 50 Mg Tabs (Trazodone Hcl) .... Ran Out Take 1 Tablet By Mouth Three Times A Day 10)  Acetaminophen 500 Mg Tabs (Acetaminophen) .... Take 1 Tablet By Mouth Four Times A Day 11)  Cyclobenzaprine Hcl 10 Mg Tabs (Cyclobenzaprine Hcl) .... Take 1/2 To 1 Tab 3 Times A Day As Needed For Back Pain.  Allergies (verified): No Known Drug Allergies  Past History:  Past Medical History: Last updated: 04/06/2010 hyperlipidemia DM since 2007 HYPERTENSION (ICD-401.9) coronary artery disease Abnormal chest x-ray  Past Surgical History: Last updated: 05/12/2009 Unremarkable  Family History: Last updated: 05/12/2009 Sister with MI at age 82 Father died of ulcer Mother with DM No FH of Colon Cancer:  Social History: Last updated: 05/12/2009 Disabled  Single  Tobacco Use - Yes.  Alcohol Use - yes Illicit Drug Use - no Patient gets regular exercise. Daily Caffeine Use: occ  Risk Factors: Exercise: yes (11/02/2009)  Risk Factors: Smoking Status: current (06/08/2010) Packs/Day: <0.25 (06/08/2010)  Review of Systems      See HPI  Physical Exam  Additional Exam:  Gen: AOx3, in acute distres due to pain, limping towards exam table cos of pain in his leg Eyes: PERRL, EOMI ENT:MMM, No erythema noted in posterior pharynx Neck: No JVD, No LAP Chest: CTAB with  good respiratory effort CVS: regular rhythmic rate, NO M/R/G, S1 S2 normal Abdo: soft,ND, BS+x4, Non tender and No hepatosplenomegaly EXT: No odema noted,  mild pain above 45 degree on SLRT on left,  left fifth toe not painful on forceful extension and flexion. No numbness or weakness noted Neuro: Non focal, gait is antalgic due to pain. Skin: no rashes noted.    Impression & Recommendations:  Problem # 1:  LOW BACK PAIN, ACUTE (ICD-724.2) Assessment New Discussed use of moist heat or ice, modified activities, medications, and stretching/strengthening exercises. Back care instructions given. To be seen in 2 weeks if no improvement; sooner if worsening of symptoms.  I prescribed him flexeril to be taken at bedtime and acetaminiphen to be taken during day time for pain relief. It is important to continue activities of daily living. Plan to discuss about steroid injection or referrral  to sports medicine if this does not help.  His updated medication list for this problem includes:    Aspirin Ec 325 Mg Tbec (Aspirin) .Marland Kitchen... Take one tablet by mouth daily    Acetaminophen 500 Mg Tabs (Acetaminophen) .Marland Kitchen... Take 1 tablet by mouth four times a day    Cyclobenzaprine Hcl 10 Mg Tabs (Cyclobenzaprine hcl) .Marland Kitchen... Take 1/2 to 1 tab 3 times a day as needed for back pain.  Problem # 2:  TOBACCO ABUSE (ICD-305.1) Assessment: Comment Only  Encouraged smoking cessation and discussed different methods for smoking cessation. Denied quitting at this time.  Problem # 3:  DM (ICD-250.00) Assessment: Comment Only Patient has been having deterioration of his HBa1c on current meds. I discussed increasing his metformin to 1000 two times a day. He did tal to Jamison Neighbor about the management of his Diabetes in a btter way. His updated medication list for this problem includes:    Metformin Hcl 500 Mg Tabs (Metformin hcl) .Marland Kitchen... Take 1 tablet by mouth once a day    Aspirin Ec 325 Mg Tbec (Aspirin) .Marland Kitchen... Take one tablet by mouth daily  Orders: T- Capillary Blood Glucose (44010) T-Hgb A1C (in-house) (27253GU)  Labs Reviewed: Creat: 1.3 (06/17/2009)     Last Eye Exam: No diabetic retinopathy.     (02/07/2010) Reviewed HgBA1c results: 6.2 (06/08/2010)  7.1 (02/14/2010)  Problem # 4:  HYPERTENSION (ICD-401.9) Assessment: Comment Only In the setting of acute pain, I would recheck BP in subsequent visits and mange meds accordingly. His updated medication list for this problem includes:    Metoprolol Succinate 25 Mg Xr24h-tab (Metoprolol succinate) .Marland Kitchen... Take one tablet by mouth daily  BP today: 141/95 Prior BP: 121/78 (04/06/2010)  Labs Reviewed: K+: 4.0 (06/17/2009) Creat: : 1.3 (06/17/2009)   Chol: 178 (04/06/2010)   HDL: 51.70 (04/06/2010)   LDL: 107 (04/06/2010)   TG: 98.0 (04/06/2010)  Complete Medication List: 1)  Metformin Hcl 500 Mg Tabs (Metformin hcl) .... Take 1 tablet by mouth once a day 2)  Nitrostat 0.4 Mg Subl (Nitroglycerin) .... Once every 5 minutes as needed for chest pain 3)  Metoprolol Succinate 25 Mg Xr24h-tab (Metoprolol succinate) .... Take one tablet by mouth daily 4)  Multivitamins Tabs (Multiple vitamin) .... One tablet by mouth once daily 5)  Aspirin Ec 325 Mg Tbec (Aspirin) .... Take one tablet by mouth daily 6)  Protonix 40 Mg Tbec (Pantoprazole sodium) .... Take 1 tablet by mouth once a day 7)  Crestor 40 Mg Tabs (Rosuvastatin calcium) .... Take one tablet by mouth daily. 8)  Lunesta 2 Mg Tabs (Eszopiclone) .... Take 1 tab by mouth at bedtime 9)  Trazodone Hcl 50 Mg Tabs (Trazodone hcl) .... Ran out take 1 tablet by mouth three times a day 10)  Acetaminophen 500 Mg Tabs (Acetaminophen) .... Take 1 tablet by mouth four times a day 11)  Cyclobenzaprine Hcl 10 Mg Tabs (Cyclobenzaprine hcl) .... Take 1/2 to 1 tab 3 times a day as needed for back pain.  Patient Instructions: 1)  Please schedule a follow-up appointment in 2 weeks. 2)  Please do not rest alot and try to continue your normal functioning level. 3)  Tobacco is very bad for your health and your loved ones! You Should stop smoking!. 4)  Stop Smoking Tips: Choose a Quit date. Cut down before the  Quit date. decide what you will do as a substitute when you feel the urge to smoke(gum,toothpick,exercise). 5)  It is important that you exercise  regularly at least 20 minutes 5 times a week. If you develop chest pain, have severe difficulty breathing, or feel very tired , stop exercising immediately and seek medical attention. 6)  Take an Aspirin every day. 7)  Check your blood sugars regularly. If your readings are usually above : 200 or below 70 you should contact our office. 8)  It is important that your Diabetic A1c level is checked every 3 months. 9)  See your eye doctor yearly to check for diabetic eye damage. 10)  Check your feet each night for sore areas, calluses or signs of infection. 11)  Check your Blood Pressure regularly. If it is above: 130/80 you should make an appointment. Prescriptions: CYCLOBENZAPRINE HCL 10 MG TABS (CYCLOBENZAPRINE HCL) take 1/2 to 1 tab 3 times a day as needed for back pain.  #40 x 0   Entered and Authorized by:   Lars Mage MD   Signed by:   Lars Mage MD on 06/08/2010   Method used:   Print then Give to Patient   RxID:   [id number]   Prevention & Chronic Care Immunizations   Influenza vaccine: Not documented   Influenza vaccine deferral: Refused  (02/14/2010)    Tetanus booster: 06/01/2009: Td   Tetanus booster due: 06/02/2019    Pneumococcal vaccine: Pneumovax  (08/29/2009)    H. zoster vaccine: Not documented   H. zoster vaccine deferral: Deferred  (11/02/2009)  Colorectal Screening   Hemoccult: Not documented   Hemoccult action/deferral: Ordered  (08/29/2009)    Colonoscopy: Not documented   Colonoscopy action/deferral: Deferred  (06/01/2009)  Other Screening   PSA: Not documented   PSA action/deferral: Discussed-PSA declined  (06/01/2009)   Smoking status: current  (06/08/2010)   Smoking cessation counseling: yes  (06/08/2010)  Diabetes Mellitus   HgbA1C: 6.2  (06/08/2010)   HgbA1C action/deferral: Ordered   (08/29/2009)   Hemoglobin A1C due: 07/19/2009    Eye exam: No diabetic retinopathy.     (02/07/2010)   Diabetic eye exam action/deferral: no diabetic retinopathy  (02/16/2010)   Eye exam due: 02/2011    Foot exam: Not documented   Foot exam action/deferral: Do today   High risk foot: No  (06/01/2009)   Foot care education: Done  (06/01/2009)   Foot exam due: 05/03/2010    Urine microalbumin/creatinine ratio: Not documented   Urine microalbumin action/deferral: Ordered  Lipids   Total Cholesterol: 178  (04/06/2010)   LDL: 107  (04/06/2010)   LDL Direct: Not documented   HDL: 51.70  (04/06/2010)   Triglycerides: 98.0  (04/06/2010)   Lipid panel due: 06/01/2010    SGOT (AST): 20  (04/06/2010)   SGPT (ALT): 17  (04/06/2010)   Alkaline phosphatase: 58  (04/06/2010)   Total bilirubin: 0.7  (04/06/2010)  Hypertension   Last Blood Pressure: 141 / 95  (06/08/2010)   Serum creatinine: 1.3  (06/17/2009)   Serum potassium 4.0  (06/17/2009)   Basic metabolic panel due: 06/01/2010  Self-Management Support :   Personal Goals (by the next clinic visit) :     Personal A1C goal: 7  (11/02/2009)     Personal blood pressure goal: 140/90  (11/02/2009)     Personal LDL goal: 100  (06/01/2009)    Patient will work on the following items until the next clinic visit to reach self-care goals:     Medications and monitoring: take my medicines every day, check my blood sugar, bring all of my medications to every visit, examine my feet every day  (  06/08/2010)     Eating: drink diet soda or water instead of juice or soda, eat more vegetables, use fresh or frozen vegetables, eat foods that are low in salt, eat baked foods instead of fried foods, eat fruit for snacks and desserts, limit or avoid alcohol  (06/08/2010)     Activity: take a 30 minute walk every day  (02/14/2010)     Other: 1/4 mile walk on cooler days  (08/29/2009)    Diabetes self-management support: Written self-care plan, Education  handout, Pre-printed educational material, Resources for patients handout  (06/08/2010)   Diabetes care plan printed   Diabetes education handout printed    Hypertension self-management support: Written self-care plan, Education handout, Pre-printed educational material, Resources for patients handout  (06/08/2010)   Hypertension self-care plan printed.   Hypertension education handout printed    Lipid self-management support: Written self-care plan, Education handout, Pre-printed educational material, Resources for patients handout  (06/08/2010)   Lipid self-care plan printed.   Lipid education handout printed      Resource handout printed.    Vital Signs:  Patient profile:   60 year old Yu Height:      73 inches (185.Timothy cm) Weight:      185.01 pounds (84.10 kg) BMI:     24.50 Temp:     98.7 degrees F (37.06 degrees C) oral Pulse rate:   97 / minute BP sitting:   141 / 95  (right arm)  Vitals Entered By: Angelina Ok RN (June 08, 2010 2:13 PM)    Laboratory Results   Blood Tests   Date/Time Received: June 08, 2010 2:34 PM  Date/Time Reported: Burke Keels  June 08, 2010 2:34 PM   HGBA1C: 6.2%   (Normal Range: Non-Diabetic - 3-6%   Control Diabetic - 6-8%) CBG Random:: 186mg /dL

## 2011-01-02 NOTE — Assessment & Plan Note (Signed)
Summary: per check out/sf   Referring Provider:  Ned Grace, MD Primary Provider:  Clerance Lav MD   History of Present Illness: Pleasant gentleman I previously saw for chest and abdominal pain. The symptoms have been present and are mainly since 2007. A Myoview in June 2010 showed inferior ischemia that was mild. Ejection fraction was 51%. Because of his persistent symptoms we scheduled a cardiac catheterization. This was performed on June 24, 2009. It showed normal LV function. There was moderate disease in the circumflex and LAD. However there was severe stenosis in the mid right coronary artery at 95%. The patient subsequently had a bare-metal stent to the right coronary artery. Patient also has history of abnormal chest x-ray. I last saw him in August of 2010. Since then he denies any dyspnea, chest pain, palpitations or syncope. He has noticed a nonproductive cough over the past one month.  Current Medications (verified): 1)  Metformin Hcl 500 Mg Tabs (Metformin Hcl) .... Take 1 Tablet By Mouth Once A Day 2)  Nitrostat 0.4 Mg Subl (Nitroglycerin) .... Once Every 5 Minutes As Needed For Chest Pain 3)  Lisinopril 5 Mg Tabs (Lisinopril) .Marland Kitchen.. 1 Tab By Mouth Every Other Day 4)  Multivitamins   Tabs (Multiple Vitamin) .... One Tablet By Mouth Once Daily 5)  Aspirin Ec 325 Mg Tbec (Aspirin) .... Take One Tablet By Mouth Daily 6)  Protonix 40 Mg Tbec (Pantoprazole Sodium) .... Take 1 Tablet By Mouth Once A Day 7)  Simvastatin 40 Mg Tabs (Simvastatin) .Marland Kitchen.. 1  Tab At Bedtime 8)  Lunesta 2 Mg Tabs (Eszopiclone) .... Take 1 Tab By Mouth At Bedtime 9)  Trazodone Hcl 50 Mg Tabs (Trazodone Hcl) .... Ran Out Take 1 Tablet By Mouth Three Times A Day 10)  Plavix 75 Mg Tabs (Clopidogrel Bisulfate) .... Ran Out 1 Tab By Mouth Once Daily  Allergies: No Known Drug Allergies  Past History:  Past Medical History: hyperlipidemia DM since 2007 HYPERTENSION (ICD-401.9) coronary artery disease Abnormal  chest x-ray  Social History: Reviewed history from 05/12/2009 and no changes required. Disabled  Single  Tobacco Use - Yes.  Alcohol Use - yes Illicit Drug Use - no Patient gets regular exercise. Daily Caffeine Use: occ  Review of Systems       Problems with nonproductive cough and low back pain but no fevers or chills,  hemoptysis, dysphasia, odynophagia, melena, hematochezia, dysuria, hematuria, rash, seizure activity, orthopnea, PND, pedal edema, claudication. Remaining systems are negative.   Vital Signs:  Patient profile:   60 year old male Height:      73 inches Weight:      184 pounds BMI:     24.36 Pulse rate:   93 / minute Resp:     12 per minute BP sitting:   121 / 78  (left arm)  Vitals Entered By: Kem Parkinson (Apr 06, 2010 11:52 AM)  Physical Exam  General:  Well-developed well-nourished in no acute distress.  Skin is warm and dry.  HEENT is normal.  Neck is supple. No thyromegaly.  Chest is clear to auscultation with normal expansion.  Cardiovascular exam is regular rate and rhythm.  Abdominal exam nontender or distended. No masses palpated. Extremities show no edema. neuro grossly intact    EKG  Procedure date:  04/06/2010  Findings:      Normal sinus rhythm at a rate of 93. Axis normal. Left ventricular hypertrophy.  Impression & Recommendations:  Problem # 1:  HYPERCHOLESTEROLEMIA (ICD-272.0)  Continue statin. Check lipids and liver. His updated medication list for this problem includes:    Simvastatin 40 Mg Tabs (Simvastatin) .Marland Kitchen... 1  tab at bedtime  Orders: TLB-Lipid Panel (80061-LIPID) TLB-Hepatic/Liver Function Pnl (80076-HEPATIC)  His updated medication list for this problem includes:    Simvastatin 40 Mg Tabs (Simvastatin) .Marland Kitchen... 1  tab at bedtime  Problem # 2:  TOBACCO ABUSE (ICD-305.1) Pt counseled on discontinuing.  Problem # 3:  ABNORMAL CHEST XRAY (ICD-793.1) Repeat PA and lateral chest x-ray. Orders: T-2 View CXR  (71020TC)  Problem # 4:  CAD (ICD-414.00) Continue aspirin. Previous stent non-drug-eluting. Continue off Plavix. Continue statin. His updated medication list for this problem includes:    Nitrostat 0.4 Mg Subl (Nitroglycerin) ..... Once every 5 minutes as needed for chest pain    Metoprolol Succinate 25 Mg Xr24h-tab (Metoprolol succinate) .Marland Kitchen... Take one tablet by mouth daily    Aspirin Ec 325 Mg Tbec (Aspirin) .Marland Kitchen... Take one tablet by mouth daily  Problem # 5:  HYPERTENSION (ICD-401.9) Patient complaining of cough. Discontinue ACE inhibitor and begin Toprol 25 mg p.o. daily. His updated medication list for this problem includes:    Metoprolol Succinate 25 Mg Xr24h-tab (Metoprolol succinate) .Marland Kitchen... Take one tablet by mouth daily    Aspirin Ec 325 Mg Tbec (Aspirin) .Marland Kitchen... Take one tablet by mouth daily  Problem # 6:  DM (ICD-250.00) Management per primary care. His updated medication list for this problem includes:    Metformin Hcl 500 Mg Tabs (Metformin hcl) .Marland Kitchen... Take 1 tablet by mouth once a day    Aspirin Ec 325 Mg Tbec (Aspirin) .Marland Kitchen... Take one tablet by mouth daily  Problem # 7:  GERD (ICD-530.81)  His updated medication list for this problem includes:    Protonix 40 Mg Tbec (Pantoprazole sodium) .Marland Kitchen... Take 1 tablet by mouth once a day  Patient Instructions: 1)  Your physician recommends that you schedule a follow-up appointment in: 6 MONTHS 2)  Your physician has recommended you make the following change in your medication: STOP PLAVIX 3)  STOP LISINOPRIL 4)  START METOPROLOL SUCC 25MG  ONE TABLET ONCE DAILY Prescriptions: METOPROLOL SUCCINATE 25 MG XR24H-TAB (METOPROLOL SUCCINATE) Take one tablet by mouth daily  #30 x 12   Entered by:   Deliah Goody, RN   Authorized by:   Ferman Hamming, MD, [facility]   Signed by:   Deliah Goody, RN on 04/06/2010   Method used:   Electronically to        [facility] [phone]* (retail)       [address]       [place], [place]   [address]       Ph: [phone]       Fax: [phone]   RxID:   [id number]

## 2011-01-02 NOTE — Progress Notes (Signed)
Summary: refill/ hla  Phone Note Refill Request Message from:  Fax from Pharmacy on June 08, 2010 5:43 PM  Refills Requested: Medication #1:  METFORMIN HCL 500 MG TABS Take 1 tablet by mouth once a day   Last Refilled: 1/18 last visit 06/2010, last bmet 06/2009  Initial call taken by: Marin Roberts RN,  June 08, 2010 5:43 PM  Follow-up for Phone Call       Follow-up by: Clerance Lav MD,  June 08, 2010 8:44 PM    Prescriptions: METFORMIN HCL 500 MG TABS (METFORMIN HCL) Take 1 tablet by mouth once a day  #30 x 3   Entered and Authorized by:   Clerance Lav MD   Signed by:   Clerance Lav MD on 06/08/2010   Method used:   Electronically to        [facility] (retail)       [address]       [place], [place]  [address]       Ph: [phone]       Fax: [phone]   RxID:   [id number]

## 2011-01-02 NOTE — Assessment & Plan Note (Signed)
Summary: EST-CK/FU/MEDS/CFB   Vital Signs:  Patient profile:   60 year old male Height:      73 inches (185.42 cm) Weight:      190.04 pounds (86.38 kg) BMI:     25.16 Temp:     98.1 degrees F (36.72 degrees C) oral Pulse rate:   88 / minute BP sitting:   129 / 76  (left arm)  Vitals Entered By: Angelina Ok RN (February 14, 2010 4:05 PM) CC: Depression Is Patient Diabetic? Yes Did you bring your meter with you today? No Pain Assessment Patient in pain? no      Nutritional Status BMI of 25 - 29 = overweight CBG Result 202  Have you ever been in a relationship where you felt threatened, hurt or afraid?No   Does patient need assistance? Functional Status Self care Ambulation Normal Comments Check up. Cholesterol medicine is hurting his stomach.   Primary Care Provider:  Clerance Lav MD  CC:  Depression.  History of Present Illness: 60 years old AAM who moved to [place] from [place] about 2 years ago and was hospitalised recently for chest pain presents for hospital follow up. He is known to be diabetic and hypertensive. He also had  stress positive myoview for reversible ischemia. He subsequently had bare metal stent put in.   He is doing well and his only complain today is heartburn after taking statin medicine. He feels well otherwise and does not complain of any myalgias.    Depression History:      The patient denies a depressed mood most of the day and a diminished interest in his usual daily activities.         Preventive Screening-Counseling & Management  Alcohol-Tobacco     Smoking Status: current     Smoking Cessation Counseling: yes     Packs/Day: <0.25  Current Medications (verified): 1)  Metformin Hcl 500 Mg Tabs (Metformin Hcl) .... Take 1 Tablet By Mouth Once A Day 2)  Nitrostat 0.4 Mg Subl (Nitroglycerin) .... Once Every 5 Minutes As Needed For Chest Pain 3)  Lisinopril 5 Mg Tabs (Lisinopril) .Marland Kitchen.. 1 Tab By Mouth Every Other Day 4)  Multivitamins    Tabs (Multiple Vitamin) .... One Tablet By Mouth Once Daily 5)  Aspirin Ec 325 Mg Tbec (Aspirin) .... Take One Tablet By Mouth Daily 6)  Plavix 75 Mg Tabs (Clopidogrel Bisulfate) .... Take One Tablet By Mouth Daily 7)  Omeprazole 20 Mg Cpdr (Omeprazole) .Marland Kitchen.. 1 Tab By Mouth Once Daily 8)  Simvastatin 40 Mg Tabs (Simvastatin) .Marland Kitchen.. 1  Tab At Bedtime 9)  Nicorette 2 Mg Gum (Nicotine Polacrilex) .... Use As Per Manufacturer Instructions 10)  Lunesta 2 Mg Tabs (Eszopiclone) .... Take 1 Tab By Mouth At Bedtime 11)  Trazodone Hcl 50 Mg Tabs (Trazodone Hcl) .... Take 1 Tablet By Mouth Three Times A Day 12)  Viagra 50 Mg Tabs (Sildenafil Citrate) .... Take 1 Tablet By Mouth Once A Day  Allergies (verified): No Known Drug Allergies  Past History:  Past Medical History: Last updated: 08/01/2009 Current Problems:  DM since 2007 HYPERTENSION (ICD-401.9) CHEST PAIN (ICD-786.50)- reversible ischemia per myoview, scheduled for f/u with [facility] EPIGASTRIC PAIN (ICD-789.06) coronary artery disease Abnormal chest x-ray  Past Surgical History: Last updated: 05/12/2009 Unremarkable  Family History: Last updated: 05/12/2009 Sister with MI at age 30 Father died of ulcer Mother with DM No FH of Colon Cancer:  Social History: Last updated: 05/12/2009 Disabled  Single  Tobacco Use -  Yes.  Alcohol Use - yes Illicit Drug Use - no Patient gets regular exercise. Daily Caffeine Use: occ  Risk Factors: Exercise: yes (11/02/2009)  Risk Factors: Smoking Status: current (02/14/2010) Packs/Day: <0.25 (02/14/2010)  Physical Exam  General:  Well developed, well nourished, no acute distress. Head:  Normocephalic and atraumatic. Eyes:  PERRLA, no icterus. Ears:  Normal auditory acuity. Nose:  No deformity, discharge,  or lesions. Mouth:  No deformity or lesions, dentition normal. Neck:  Supple; no masses or thyromegaly. Lungs:  Clear throughout to auscultation. Heart:  Regular rate and rhythm;  no murmurs, rubs,  or bruits. Abdomen:  Soft, nontender and nondistended. No masses, hepatosplenomegaly or hernias noted. Normal bowel sounds. Msk:  Symmetrical with no gross deformities. Normal posture. Neurologic:  Alert and  oriented x4;  grossly normal neurologically. Skin:  Intact without significant lesions or rashes.   Impression & Recommendations:  Problem # 1:  GERD (ICD-530.81) Pt in past was reffered fot GI workup which was negative. He was prescribed protonix but could not afford at the time due to lack of insurance. I will prescribe that once more as he has means to pay for it.  Advised him to take simvastatin at different period of time and see how he does with it. I do not think that would be related to his stomach pains.   His updated medication list for this problem includes:    Protonix 40 Mg Tbec (Pantoprazole sodium) .Marland Kitchen... Take 1 tablet by mouth once a day  Problem # 2:  CAD (ICD-414.00) was on plavix but does not take it any more. He has Bare metal stent. He can pay for plavix now but I will defer it to Dr. Charlyne Quale, his cardiologist if he would like pt to continue taking it.  The following medications were removed from the medication list:    Plavix 75 Mg Tabs (Clopidogrel bisulfate) .Marland Kitchen... Take one tablet by mouth daily His updated medication list for this problem includes:    Nitrostat 0.4 Mg Subl (Nitroglycerin) ..... Once every 5 minutes as needed for chest pain    Lisinopril 5 Mg Tabs (Lisinopril) .Marland Kitchen... 1 tab by mouth every other day    Aspirin Ec 325 Mg Tbec (Aspirin) .Marland Kitchen... Take one tablet by mouth daily  The following medications were removed from the medication list:    Plavix 75 Mg Tabs (Clopidogrel bisulfate) .Marland Kitchen... Take one tablet by mouth daily His updated medication list for this problem includes:    Nitrostat 0.4 Mg Subl (Nitroglycerin) ..... Once every 5 minutes as needed for chest pain    Lisinopril 5 Mg Tabs (Lisinopril) .Marland Kitchen... 1 tab by mouth every  other day    Aspirin Ec 325 Mg Tbec (Aspirin) .Marland Kitchen... Take one tablet by mouth daily  Problem # 3:  HYPERTENSION (ICD-401.9) BP well controlled. NO changes made.  His updated medication list for this problem includes:    Lisinopril 5 Mg Tabs (Lisinopril) .Marland Kitchen... 1 tab by mouth every other day  BP today: 129/76 Prior BP: 144/93 (11/02/2009)  Labs Reviewed: K+: 4.0 (06/17/2009) Creat: : 1.3 (06/17/2009)   Chol: 129 (09/12/2009)   HDL: 43.90 (09/12/2009)   LDL: 71 (09/12/2009)   TG: 70.0 (09/12/2009)  Problem # 4:  DM (ICD-250.00) His A1C is going up. he might need more metformin. He will try tigher diet control. 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 He is continuing to follow up.  His updated medication list for this problem includes:  Metformin Hcl 500 Mg Tabs (Metformin hcl) .Marland Kitchen... Take 1 tablet by mouth once a day    Lisinopril 5 Mg Tabs (Lisinopril) .Marland Kitchen... 1 tab by mouth every other day    Aspirin Ec 325 Mg Tbec (Aspirin) .Marland Kitchen... Take one tablet by mouth daily  Orders: T- Capillary Blood Glucose (16109) T-Hgb A1C (in-house) ([id number])  Labs Reviewed: Creat: 1.3 (06/17/2009)    Reviewed HgBA1c results: 7.1 (02/14/2010)  6.2 (11/02/2009)  Problem # 5:  DEPRESSION (ICD-311) says he is doing fine. He does not have suicidal ideation. He is doing his daily acitivity without problem and has good interest in life.  His updated medication list for this problem includes:    Trazodone Hcl 50 Mg Tabs (Trazodone hcl) .Marland Kitchen... Take 1 tablet by mouth three times a day  Problem # 6:  TOBACCO ABUSE (ICD-305.1) not ready to quit. He thinks he smokes very minimal and he will let me know when  he is ready.  His updated medication list for this problem includes:    Nicorette 2 Mg Gum (Nicotine polacrilex) ..... Use as per manufacturer instructions  Complete Medication List: 1)  Metformin Hcl 500 Mg Tabs (Metformin hcl) .... Take 1 tablet by mouth once a day 2)  Nitrostat 0.4 Mg Subl  (Nitroglycerin) .... Once every 5 minutes as needed for chest pain 3)  Lisinopril 5 Mg Tabs (Lisinopril) .Marland Kitchen.. 1 tab by mouth every other day 4)  Multivitamins Tabs (Multiple vitamin) .... One tablet by mouth once daily 5)  Aspirin Ec 325 Mg Tbec (Aspirin) .... Take one tablet by mouth daily 6)  Protonix 40 Mg Tbec (Pantoprazole sodium) .... Take 1 tablet by mouth once a day 7)  Simvastatin 40 Mg Tabs (Simvastatin) .Marland Kitchen.. 1  tab at bedtime 8)  Nicorette 2 Mg Gum (Nicotine polacrilex) .... Use as per manufacturer instructions 9)  Lunesta 2 Mg Tabs (Eszopiclone) .... Take 1 tab by mouth at bedtime 10)  Trazodone Hcl 50 Mg Tabs (Trazodone hcl) .... Take 1 tablet by mouth three times a day 11)  Viagra 50 Mg Tabs (Sildenafil citrate) .... Take 1 tablet by mouth once a day  Patient Instructions: 1)  Please schedule a follow-up appointment in 3 months. Prescriptions: PROTONIX 40 MG TBEC (PANTOPRAZOLE SODIUM) Take 1 tablet by mouth once a day  #30 x 3   Entered and Authorized by:   Clerance Lav MD   Signed by:   Clerance Lav MD on 02/14/2010   Method used:   Electronically to        [facility] (retail)       [address]       [place], [place]  [address]       Ph: [phone]       Fax: [phone]   RxID:   [id number]   Prevention & Chronic Care Immunizations   Influenza vaccine: Not documented   Influenza vaccine deferral: Refused  (02/14/2010)    Tetanus booster: 06/01/2009: Td   Tetanus booster due: 06/02/2019    Pneumococcal vaccine: Pneumovax  (08/29/2009)    H. zoster vaccine: Not documented   H. zoster vaccine deferral: Deferred  (11/02/2009)  Colorectal Screening   Hemoccult: Not documented   Hemoccult action/deferral: Ordered  (08/29/2009)    Colonoscopy: Not documented   Colonoscopy action/deferral: Deferred  (06/01/2009)  Other Screening   PSA: Not documented   PSA action/deferral: Discussed-PSA declined   (06/01/2009)   Smoking status:  current  (02/14/2010)   Smoking cessation counseling: yes  (02/14/2010)  Diabetes Mellitus   HgbA1C: 7.1  (02/14/2010)   HgbA1C action/deferral: Ordered  (08/29/2009)   Hemoglobin A1C due: 07/19/2009    Eye exam: Not documented   Diabetic eye exam action/deferral: Ophthalmology referral  (06/01/2009)    Foot exam: Not documented   Foot exam action/deferral: Do today   High risk foot: No  (06/01/2009)   Foot care education: Done  (06/01/2009)   Foot exam due: 05/03/2010    Urine microalbumin/creatinine ratio: Not documented   Urine microalbumin action/deferral: Ordered    Diabetes flowsheet reviewed?: Yes   Progress toward A1C goal: At goal  Lipids   Total Cholesterol: 129  (09/12/2009)   LDL: 71  (09/12/2009)   LDL Direct: Not documented   HDL: 43.90  (09/12/2009)   Triglycerides: 70.0  (09/12/2009)   Lipid panel due: 06/01/2010  Hypertension   Last Blood Pressure: 129 / 76  (02/14/2010)   Serum creatinine: 1.3  (06/17/2009)   Serum potassium 4.0  (06/17/2009)   Basic metabolic panel due: 06/01/2010    Hypertension flowsheet reviewed?: Yes   Progress toward BP goal: At goal  Self-Management Support :   Personal Goals (by the next clinic visit) :     Personal A1C goal: 7  (11/02/2009)     Personal blood pressure goal: 140/90  (11/02/2009)     Personal LDL goal: 100  (06/01/2009)    Patient will work on the following items until the next clinic visit to reach self-care goals:     Medications and monitoring: take my medicines every day, check my blood sugar, bring all of my medications to every visit, examine my feet every day  (02/14/2010)     Eating: drink diet soda or water instead of juice or soda, eat more vegetables, eat foods that are low in salt, eat baked foods instead of fried foods, eat fruit for snacks and desserts, limit or avoid alcohol  (02/14/2010)     Activity: take a 30 minute walk every day  (02/14/2010)     Other:  1/4 mile walk on cooler days  (08/29/2009)    Diabetes self-management support: CBG self-monitoring log, Written self-care plan  (02/14/2010)   Diabetes care plan printed    Hypertension self-management support: Written self-care plan, Education handout  (02/14/2010)   Hypertension self-care plan printed.   Hypertension education handout printed     Vital Signs:  Patient profile:   60 year old male Height:      73 inches (185.42 cm) Weight:      190.04 pounds (86.38 kg) BMI:     25.16 Temp:     98.1 degrees F (36.72 degrees C) oral Pulse rate:   88 / minute BP sitting:   129 / 76  (left arm)  Vitals Entered By: Angelina Ok RN (February 14, 2010 4:05 PM)  Laboratory Results   Blood Tests   Date/Time Received: February 14, 2010 4:35 PM Date/Time Reported: Alric Quan  February 14, 2010 4:35 PM   HGBA1C: 7.1%   (Normal Range: Non-Diabetic - 3-6%   Control Diabetic - 6-8%) CBG Random:: 202mg /dL

## 2011-01-02 NOTE — Letter (Signed)
Summary: [facility]   Imported By: Margie Billet 03/08/2010 10:14:52  _____________________________________________________________________  External Attachment:    Type:   Image     Comment:   External Document

## 2011-01-02 NOTE — Miscellaneous (Signed)
Summary: Orders Update  Clinical Lists Changes 

## 2011-01-02 NOTE — Progress Notes (Signed)
Summary: Refill/gh  Phone Note Refill Request Message from:  Fax from Pharmacy on October 09, 2010 4:05 PM  Refills Requested: Medication #1:  METFORMIN HCL 500 MG TABS Take 1 tablet by mouth in AM and two in PM once a day   Last Refilled: 08/29/2010  Method Requested: Electronic Initial call taken by: Angelina Ok RN,  October 09, 2010 4:05 PM  Follow-up for Phone Call       Follow-up by: Clerance Lav MD,  October 23, 2010 7:27 AM    Prescriptions: METFORMIN HCL 500 MG TABS (METFORMIN HCL) Take 1 tablet by mouth in AM and two in PM once a day  #90 x 3   Entered and Authorized by:   Clerance Lav MD   Signed by:   Clerance Lav MD on 10/23/2010   Method used:   Electronically to        [facility] (retail)       [address]       [place], [place]  [address]       Ph: [phone]       Fax: [phone]   RxID:   [id number]

## 2011-01-02 NOTE — Assessment & Plan Note (Signed)
Summary: FEET/SB.   Vital Signs:  Patient profile:   60 year old male Height:      73 inches Weight:      181.6 pounds BMI:     24.05 Temp:     98.3 degrees F oral Pulse rate:   85 / minute BP sitting:   127 / 86  (right arm)  Vitals Entered By: Filomena Jungling NT II (August 15, 2010 3:49 PM) CC: follow-up visit of feet, Depression Is Patient Diabetic? Yes Did you bring your meter with you today? No Pain Assessment Patient in pain? no      Nutritional Status BMI of 25 - 29 = overweight  Have you ever been in a relationship where you felt threatened, hurt or afraid?No   Does patient need assistance? Functional Status Self care Ambulation Normal   Primary Care Provider:  Clerance Lav MD  CC:  follow-up visit of feet and Depression.  History of Present Illness: Patient is 60 year old man with pmh significant for CAD s/p bare metal stent, HTN, BPH, HLD, Depression who presents for follow up regarding foot pain.   He is complaining of unresolved foot pain at the base of foot on left side. His pain is more prominent when he applies weight on the foot while walking.  It is more severe in early morning.  It was thought that pt has plantar fasciitis and was given celebrex. States celebrex has not relieved the pain. Patient states it is accompanied with numbness and tingling.   He also is having problem with frequent urination at the night time. He has difficulty initiating the urination. He denies pain, urgency and frequency during day time. It was thought his symptoms were due to BPH and was started on flomax. Pt states that flomax is helping.   Depression History:      The patient denies a depressed mood most of the day and a diminished interest in his usual daily activities.         Preventive Screening-Counseling & Management  Alcohol-Tobacco     Smoking Status: current     Smoking Cessation Counseling: yes     Packs/Day: <0.25  Caffeine-Diet-Exercise     Does  Patient Exercise: yes     Exercise (avg: min/session): 8:01  Current Medications (verified): 1)  Metformin Hcl 500 Mg Tabs (Metformin Hcl) .... Take 1 Tablet By Mouth in Am and Two in Pm Once A Day 2)  Nitrostat 0.4 Mg Subl (Nitroglycerin) .... Once Every 5 Minutes As Needed For Chest Pain 3)  Metoprolol Succinate 25 Mg Xr24h-Tab (Metoprolol Succinate) .... Take One Tablet By Mouth Daily 4)  Multivitamins   Tabs (Multiple Vitamin) .... One Tablet By Mouth Once Daily 5)  Aspirin Ec 325 Mg Tbec (Aspirin) .... Take One Tablet By Mouth Daily 6)  Protonix 40 Mg Tbec (Pantoprazole Sodium) .... Take 1 Tablet By Mouth Once A Day 7)  Crestor 40 Mg Tabs (Rosuvastatin Calcium) .... Take One Tablet By Mouth Daily. 8)  Lunesta 2 Mg Tabs (Eszopiclone) .... Take 1 Tab By Mouth At Bedtime 9)  Trazodone Hcl 50 Mg Tabs (Trazodone Hcl) .... Ran Out Take 1 Tablet By Mouth Three Times A Day 10)  Acetaminophen 500 Mg Tabs (Acetaminophen) .... Take 1 Tablet By Mouth Four Times A Day 11)  Cyclobenzaprine Hcl 10 Mg Tabs (Cyclobenzaprine Hcl) .... Take 1/2 To 1 Tab 3 Times A Day As Needed For Back Pain. 12)  Ibuprofen 200 Mg Caps (Ibuprofen) .... Two  Capsules Twice A Day 13)  Flomax 0.4 Mg Caps (Tamsulosin Hcl) .... Once Daily  Allergies (verified): No Known Drug Allergies  Past History:  Past Medical History: Last updated: 04/06/2010 hyperlipidemia DM since 2007 HYPERTENSION (ICD-401.9) coronary artery disease Abnormal chest x-ray  Past Surgical History: Last updated: 05/12/2009 Unremarkable  Family History: Last updated: 05/12/2009 Sister with MI at age 19 Father died of ulcer Mother with DM No FH of Colon Cancer:  Social History: Last updated: 08/15/2010 Disabled  Single  Tobacco Use - Yes. Smokes 1 cigarette per day Alcohol Use - yes Illicit Drug Use - no Patient gets regular exercise. Daily Caffeine Use: occ  Risk Factors: Exercise: yes (08/15/2010)  Risk Factors: Smoking Status:  current (08/15/2010) Packs/Day: <0.25 (08/15/2010)  Social History: Disabled  Single  Tobacco Use - Yes. Smokes 1 cigarette per day Alcohol Use - yes Illicit Drug Use - no Patient gets regular exercise. Daily Caffeine Use: occ  Physical Exam  General:  alert and well-developed.   Head:  normocephalic and atraumatic.   Neck:  supple.   Lungs:  normal respiratory effort and normal breath sounds.   Heart:  normal rate and regular rhythm.   Abdomen:  soft and non-tender.   Msk:  normal ROM, no joint swelling, and no joint warmth.   Pulses:  pulses equal DP bilaterally  Extremities:  no LE edema present   Diabetes Management Exam:    Foot Exam (with socks and/or shoes not present):       Sensory-Pinprick/Light touch:          Left medial foot (L-4): normal          Left dorsal foot (L-5): normal          Left lateral foot (S-1): normal          Right medial foot (L-4): normal          Right dorsal foot (L-5): normal          Right lateral foot (S-1): normal       Sensory-Monofilament:          Left foot: normal          Right foot: normal       Inspection:          Left foot: normal          Right foot: normal       Nails:          Left foot: normal          Right foot: normal   Impression & Recommendations:  Problem # 1:  PLANTAR FASCIITIS, LEFT (ICD-728.71) Patient's foot pain is most consistent with plantar fasciitis along with diabetic neuropathy. He does not have any obvious foot ulcers or lesions.  Patient has equal sensation, with monofilament testing. Advised patient to continue with ibuprofen, along with gabapentin for neuropathic symptoms. If symptoms worsen or do not improve, consider podiatry referral for further work up.   His updated medication list for this problem includes:    Ibuprofen 200 Mg Caps (Ibuprofen) .Marland Kitchen..Marland Kitchen Two capsules twice a day  Problem # 2:  DM (ICD-250.00) A1C within normal limits. Continue current regimen, along with gabapentin for  neuropathic symptoms, as mentioned in problem #1.   His updated medication list for this problem includes:    Metformin Hcl 500 Mg Tabs (Metformin hcl) .Marland Kitchen... Take 1 tablet by mouth in am and two in pm once a day    Aspirin Ec  325 Mg Tbec (Aspirin) .Marland Kitchen... Take one tablet by mouth daily  Labs Reviewed: Creat: 1.3 (06/17/2009)     Last Eye Exam: No diabetic retinopathy.    (02/07/2010) Reviewed HgBA1c results: 6.2 (06/08/2010)  7.1 (02/14/2010)  Problem # 3:  LOW BACK PAIN, ACUTE (ICD-724.2) Stable, but patient still states he is having muscle spasms, will keep flexeril on board for now.   His updated medication list for this problem includes:    Aspirin Ec 325 Mg Tbec (Aspirin) .Marland Kitchen... Take one tablet by mouth daily    Acetaminophen 500 Mg Tabs (Acetaminophen) .Marland Kitchen... Take 1 tablet by mouth four times a day    Cyclobenzaprine Hcl 10 Mg Tabs (Cyclobenzaprine hcl) .Marland Kitchen... Take 1/2 to 1 tab 3 times a day as needed for back pain.    Ibuprofen 200 Mg Caps (Ibuprofen) .Marland Kitchen..Marland Kitchen Two capsules twice a day  Problem # 4:  DEPRESSION (ICD-311) States trazodone is helping.   His updated medication list for this problem includes:    Trazodone Hcl 50 Mg Tabs (Trazodone hcl) .Marland Kitchen... Ran out take 1 tablet by mouth three times a day  Problem # 5:  Preventive Health Care (ICD-V70.0) flu shot today.  Complete Medication List: 1)  Metformin Hcl 500 Mg Tabs (Metformin hcl) .... Take 1 tablet by mouth in am and two in pm once a day 2)  Nitrostat 0.4 Mg Subl (Nitroglycerin) .... Once every 5 minutes as needed for chest pain 3)  Metoprolol Succinate 25 Mg Xr24h-tab (Metoprolol succinate) .... Take one tablet by mouth daily 4)  Multivitamins Tabs (Multiple vitamin) .... One tablet by mouth once daily 5)  Aspirin Ec 325 Mg Tbec (Aspirin) .... Take one tablet by mouth daily 6)  Protonix 40 Mg Tbec (Pantoprazole sodium) .... Take 1 tablet by mouth once a day 7)  Crestor 40 Mg Tabs (Rosuvastatin calcium) .... Take one  tablet by mouth daily. 8)  Lunesta 2 Mg Tabs (Eszopiclone) .... Take 1 tab by mouth at bedtime 9)  Trazodone Hcl 50 Mg Tabs (Trazodone hcl) .... Ran out take 1 tablet by mouth three times a day 10)  Acetaminophen 500 Mg Tabs (Acetaminophen) .... Take 1 tablet by mouth four times a day 11)  Cyclobenzaprine Hcl 10 Mg Tabs (Cyclobenzaprine hcl) .... Take 1/2 to 1 tab 3 times a day as needed for back pain. 12)  Ibuprofen 200 Mg Caps (Ibuprofen) .... Two capsules twice a day 13)  Flomax 0.4 Mg Caps (Tamsulosin hcl) .... Once daily 14)  Gabapentin 100 Mg Caps (Gabapentin) .... Take 1 tab by mouth at bedtime  Patient Instructions: 1)  Please schedule a follow-up appointment in 1 month. Prescriptions: GABAPENTIN 100 MG CAPS (GABAPENTIN) Take 1 tab by mouth at bedtime  #31 x 0   Entered and Authorized by:   Melida Quitter MD   Signed by:   Melida Quitter MD on 08/15/2010   Method used:   Electronically to        [facility] (retail)       [address]       [place], [place]  [address]       Ph: [phone]       Fax: [phone]   RxID:   [id number] CYCLOBENZAPRINE HCL 10 MG TABS (CYCLOBENZAPRINE HCL) take 1/2 to 1 tab 3 times a day as needed for back pain.  #40 x 0   Entered and Authorized by:   Melida Quitter MD  Signed by:   Melida Quitter MD on 08/15/2010   Method used:   Electronically to        [facility] (retail)       [address]       [place], [place]  [address]       Ph: [phone]       Fax: [phone]   RxID:   [id number] TRAZODONE HCL 50 MG TABS (TRAZODONE HCL) RAN OUT Take 1 tablet by mouth three times a day  #90 x 0   Entered and Authorized by:   Melida Quitter MD   Signed by:   Melida Quitter MD on 08/15/2010   Method used:   Electronically to        [facility] (retail)       [address]       [place], [place]  [address]       Ph: [phone]       Fax:  [phone]   RxID:   [id number]     Prevention & Chronic Care Immunizations   Influenza vaccine: Not documented   Influenza vaccine deferral: Refused  (02/14/2010)    Tetanus booster: 06/01/2009: Td   Tetanus booster due: 06/02/2019    Pneumococcal vaccine: Pneumovax  (08/29/2009)   Pneumococcal vaccine deferral: Deferred  (08/01/2010)    H. zoster vaccine: Not documented   H. zoster vaccine deferral: Deferred  (11/02/2009)  Colorectal Screening   Hemoccult: Not documented   Hemoccult action/deferral: Ordered  (08/29/2009)    Colonoscopy: Not documented   Colonoscopy action/deferral: Deferred  (06/01/2009)  Other Screening   PSA: Not documented   PSA action/deferral: Discussed-PSA declined  (06/01/2009)   Smoking status: current  (08/15/2010)   Smoking cessation counseling: yes  (08/15/2010)  Diabetes Mellitus   HgbA1C: 6.2  (06/08/2010)   HgbA1C action/deferral: Ordered  (08/29/2009)   Hemoglobin A1C due: 07/19/2009    Eye exam: No diabetic retinopathy.     (02/07/2010)   Diabetic eye exam action/deferral: no diabetic retinopathy  (02/16/2010)   Eye exam due: 02/2011    Foot exam: yes  (08/15/2010)   Foot exam action/deferral: Do today   High risk foot: No  (06/01/2009)   Foot care education: Done  (06/01/2009)   Foot exam due: 05/03/2010    Urine microalbumin/creatinine ratio: Not documented   Urine microalbumin action/deferral: Ordered    Diabetes flowsheet reviewed?: Yes   Progress toward A1C goal: At goal  Lipids   Total Cholesterol: 178  (04/06/2010)   LDL: 107  (04/06/2010)   LDL Direct: Not documented   HDL: 51.70  (04/06/2010)   Triglycerides: 98.0  (04/06/2010)   Lipid panel due: 06/01/2010    SGOT (AST): 20  (04/06/2010)   SGPT (ALT): 17  (04/06/2010)   Alkaline phosphatase: 58  (04/06/2010)   Total bilirubin: 0.7  (04/06/2010)    Lipid flowsheet reviewed?: Yes   Progress toward LDL goal: Deteriorated  Hypertension   Last  Blood Pressure: 127 / 86  (08/15/2010)   Serum creatinine: 1.3  (06/17/2009)   Serum potassium 4.0  (06/17/2009)   Basic metabolic panel due: 06/01/2010    Hypertension flowsheet reviewed?: Yes   Progress toward BP goal: At goal  Self-Management Support :   Personal Goals (by the next clinic visit) :     Personal A1C goal: 6  (08/15/2010)     Personal  blood pressure goal: 130/80  (08/15/2010)     Personal LDL goal: 70  (08/15/2010)    Diabetes self-management support: Written self-care plan  (08/15/2010)   Diabetes care plan printed    Hypertension self-management support: Written self-care plan  (08/15/2010)   Hypertension self-care plan printed.    Lipid self-management support: Written self-care plan  (08/15/2010)   Lipid self-care plan printed.   Nursing Instructions: Give Flu vaccine today   Appended Document: FEET/SB.   Influenza Vaccine    Vaccine Type: Fluvax MCR    Site: left deltoid    Mfr: GlaxoSmithKline    Dose: 0.5 ml    Route: IM    Given by: Chinita Pester RN    Exp. Date: 06/02/2011    Lot #: [id number]    VIS given: 06/27/10 version given August 15, 2010.  Flu Vaccine Consent Questions    Do you have a history of severe allergic reactions to this vaccine? no    Any prior history of allergic reactions to egg and/or gelatin? no    Do you have a sensitivity to the preservative Thimersol? no    Do you have a past history of Guillan-Barre Syndrome? no    Do you currently have an acute febrile illness? no    Have you ever had a severe reaction to latex? no    Vaccine information given and explained to patient? yes

## 2011-01-02 NOTE — Assessment & Plan Note (Signed)
Summary: Timothy Yu   Visit Type:  Follow-up Referring Provider:  Ned Grace, MD Primary Provider:  Clerance Lav MD  CC:  Sob.  History of Present Illness: Pleasant gentleman I previously saw for chest and abdominal pain. A Myoview in June 2010 showed inferior ischemia that was mild. Ejection fraction was 51%. Because of his persistent symptoms we scheduled a cardiac catheterization. This was performed on June 24, 2009. It showed normal LV function. There was moderate disease in the circumflex and LAD. However there was severe stenosis in the mid right coronary artery at 95%. The patient subsequently had a bare-metal stent to the right coronary artery. I last saw him in May of 2011. Since then the patient has dyspnea with more extreme activities but not with routine activities. It is relieved with rest. It is not associated with chest pain. There is no orthopnea, PND or pedal edema. There is no syncope or palpitations. There is no exertional chest pain.   Current Medications (verified): 1)  Metformin Hcl 500 Mg Tabs (Metformin Hcl) .... Take 1 Tablet By Mouth in Am and Two in Pm Once A Day 2)  Nitrostat 0.4 Mg Subl (Nitroglycerin) .... Once Every 5 Minutes As Needed For Chest Pain 3)  Metoprolol Succinate 25 Mg Xr24h-Tab (Metoprolol Succinate) .... Take One Tablet By Mouth Daily 4)  Multivitamins   Tabs (Multiple Vitamin) .... One Tablet By Mouth Once Daily 5)  Aspirin Ec 325 Mg Tbec (Aspirin) .... Take One Tablet By Mouth Daily Timothy)  Protonix 40 Mg Tbec (Pantoprazole Sodium) .... Take 1 Tablet By Mouth Once A Day 7)  Crestor 40 Mg Tabs (Rosuvastatin Calcium) .... Take One Tablet By Mouth Daily. 8)  Lunesta 2 Mg Tabs (Eszopiclone) .... Take 1 Tab By Mouth At Bedtime 9)  Trazodone Hcl 50 Mg Tabs (Trazodone Hcl) .... Ran Out Take 1 Tablet By Mouth Three Times A Day 10)  Acetaminophen 500 Mg Tabs (Acetaminophen) .... Take 1 Tablet By Mouth Four Times A Day 11)  Cyclobenzaprine Hcl 10 Mg  Tabs (Cyclobenzaprine Hcl) .... Take 1/2 To 1 Tab 3 Times A Day As Needed For Back Pain. 12)  Ibuprofen 200 Mg Caps (Ibuprofen) .... Two Capsules Twice A Day 13)  Flomax 0.4 Mg Caps (Tamsulosin Hcl) .... Once Daily 14)  Gabapentin 100 Mg Caps (Gabapentin) .... Take 1 Tab By Mouth At Bedtime 15)  Pletal 50 Mg Tabs (Cilostazol) .... Take 1 Tablet By Mouth Two Times A Day  Allergies (verified): No Known Drug Allergies  Past History:  Past Medical History: Reviewed history from 04/06/2010 and no changes required. hyperlipidemia DM since 2007 HYPERTENSION (ICD-401.9) coronary artery disease Abnormal chest x-ray  Social History: Reviewed history from 08/15/2010 and no changes required. Disabled  Single  Tobacco Use - Yes Alcohol Use - yes Illicit Drug Use - no Patient gets regular exercise. Daily Caffeine Use: occ  Review of Systems       Some pain in feet and back but no fevers or chills, productive cough, hemoptysis, dysphasia, odynophagia, melena, hematochezia, dysuria, hematuria, rash, seizure activity, orthopnea, PND, pedal edema, claudication. Remaining systems are negative.   Vital Signs:  Patient profile:   60 year old male Height:      73 inches Weight:      187 pounds BMI:     24.76 Pulse rate:   92 / minute Pulse rhythm:   regular Resp:     18 per minute BP sitting:   148 / 93  (left  arm) Cuff size:   large  Vitals Entered By: Vikki Ports (October 11, 2010 2:25 PM)  Physical Exam  General:  Well-developed well-nourished in no acute distress.  Skin is warm and dry.  HEENT is normal.  Neck is supple. No thyromegaly.  Chest is clear to auscultation with normal expansion.  Cardiovascular exam is regular rate and rhythm.  Abdominal exam nontender or distended. No masses palpated. Extremities show no edema. neuro grossly intact    EKG  Procedure date:  10/11/2010  Findings:      chief Sinus rhythm at a rate of 93. Axis normal. Left ventricular  hypertrophy. No ST changes.  Impression & Recommendations:  Problem # 1:  HYPERCHOLESTEROLEMIA (ICD-272.0) Patient having problems with the expense of Crestor. Discontinue that medication and began Lipitor 40 mg p.o. daily when generic available. Check lipids and liver  Timothy weeks later. His updated medication list for this problem includes:    Crestor 40 Mg Tabs (Rosuvastatin calcium) .Marland Kitchen... Take one tablet by mouth daily.  Problem # 2:  TOBACCO ABUSE (ICD-305.1) Patient counseled on discontinuing.  Problem # 3:  CAD (ICD-414.00)  Continue aspirin, beta blocker and statin. His updated medication list for this problem includes:    Nitrostat 0.4 Mg Subl (Nitroglycerin) ..... Once every 5 minutes as needed for chest pain    Metoprolol Succinate 50 Mg Xr24h-tab (Metoprolol succinate) .Marland Kitchen... 1 once daily    Aspirin Ec 325 Mg Tbec (Aspirin) .Marland Kitchen... Take one tablet by mouth daily    Pletal 50 Mg Tabs (Cilostazol) .Marland Kitchen... Take 1 tablet by mouth two times a day  Orders: EKG w/ Interpretation ([address])  Problem # 4:  HYPERTENSION (ICD-401.9) Blood pressure elevated. Increase Toprol to 50 mg p.o. daily. His updated medication list for this problem includes:    Metoprolol Succinate 50 Mg Xr24h-tab (Metoprolol succinate) .Marland Kitchen... 1 once daily    Aspirin Ec 325 Mg Tbec (Aspirin) .Marland Kitchen... Take one tablet by mouth daily  Problem # 5:  DM (ICD-250.00)  His updated medication list for this problem includes:    Metformin Hcl 500 Mg Tabs (Metformin hcl) .Marland Kitchen... Take 1 tablet by mouth in am and two in pm once a day    Aspirin Ec 325 Mg Tbec (Aspirin) .Marland Kitchen... Take one tablet by mouth daily  Patient Instructions: 1)  Your physician recommends that you schedule a follow-up appointment in: YEAR WITH DR CRENSHAW 2)  CALL OFFICE IN Timothy WEEKS TO SEE IF LIPITOR IS GENERIC 3)  Your physician has recommended you make the following change in your medication: INCREASE METOPROLOL TO 50 MG 1 once daily  Prescriptions: METOPROLOL  SUCCINATE 50 MG XR24H-TAB (METOPROLOL SUCCINATE) 1 once daily  #30 x 11   Entered by:   Scherrie Bateman, LPN   Authorized by:   Ferman Hamming, MD, [facility]   Signed by:   Scherrie Bateman, LPN on 16/09/9603   Method used:   Electronically to        [facility] (retail)       [address]       [place], [place]  [address]       Ph: [phone]       Fax: [phone]   RxID:   [id number]

## 2011-01-02 NOTE — Progress Notes (Signed)
Summary: Refill/gh  Phone Note Refill Request Message from:  Fax from Pharmacy on August 04, 2010 11:42 AM  Refills Requested: Medication #1:  CELEBREX 200 MG CAP Take one (1) tablet by mouth two (2) times a day with food   Last Refilled: 08/01/2010 Not covered by pt's insurance.  Needs something else.   Method Requested: Electronic Initial call taken by: Angelina Ok RN,  August 04, 2010 11:43 AM  Follow-up for Phone Call        ask patient to take ibuprofen 400mg  two times a day. If stomach irritation felt, change it to tylenol.  Follow-up by: Clerance Lav MD,  August 07, 2010 6:55 PM    New/Updated Medications: IBUPROFEN 200 MG CAPS (IBUPROFEN) Two capsules twice a day Prescriptions: IBUPROFEN 200 MG CAPS (IBUPROFEN) Two capsules twice a day  #40 x 0   Entered and Authorized by:   Clerance Lav MD   Signed by:   Clerance Lav MD on 08/07/2010   Method used:   Electronically to        [facility] (retail)       [address]       [place], [place]  [address]       Ph: [phone]       Fax: [phone]   RxID:   [id number]   Appended Document: Refill/gh Call to pt to inform him that a prescription for Ibuprofen 200 mg talets has been sent to the phrmacy.  Pt to take 400 mg twice daily and if stomach problems will need to switch to Tylenol.  Pt voiced understanding  oof plan and will call for further problems.  Angelina Ok, RN August 08, 2010 9:05 AM

## 2011-01-02 NOTE — Consult Note (Signed)
Summary: [facility]: Diabetic Eye Exam  [facility]: Diabetic Eye Exam   Imported By: Florinda Marker 02/15/2010 15:01:04  _____________________________________________________________________  External Attachment:    Type:   Image     Comment:   External Document  Appended Document: [facility]: Diabetic Eye Exam    Clinical Lists Changes  Observations: Added new observation of DMEYERECACT: no diabetic retinopathy (02/16/2010 13:57)      Appended Document: [facility]: Diabetic Eye Exam    Clinical Lists Changes  Observations: Added new observation of [id number]: 02/2011 (02/21/2010 16:02) Added new observation of DIAB EYE EX: No diabetic retinopathy.    (02/07/2010 16:03)       Diabetic Eye Exam  Procedure date:  02/07/2010  Findings:      No diabetic retinopathy.     Procedures Next Due Date:    Diabetic Eye Exam: 02/2011   Diabetic Eye Exam  Procedure date:  02/07/2010  Findings:      No diabetic retinopathy.     Procedures Next Due Date:    Diabetic Eye Exam: 02/2011

## 2011-01-04 NOTE — Letter (Signed)
Summary: Custom - Lipid  [facility], Main Office  [address]   [place], [place] [address]   Phone: [phone]  Fax: [phone]     November 29, 2010 MRN: [id number]   [facility] [address] [place], [place]  [address]   Dear Mr. Bamberg,  We have reviewed your cholesterol results.  They are as follows:     Total Cholesterol:    107 (Desirable: less than 200)       HDL  Cholesterol:     44.80  (Desirable: greater than 40 for men and 50 for women)       LDL Cholesterol:       49  (Desirable: less than 100 for low risk and less than 70 for moderate to high risk)       Triglycerides:       67.0  (Desirable: less than 150)  Our recommendations include:These numbers look good. Continue on the same medicine. Liver function is normal. Take care, Dr. Darel Hong.    Call our office at the number listed above if you have any questions.  Lowering your LDL cholesterol is important, but it is only one of a large number of "risk factors" that may indicate that you are at risk for heart disease, stroke or other complications of hardening of the arteries.  Other risk factors include:   A.  Cigarette Smoking* B.  High Blood Pressure* C.  Obesity* D.   Low HDL Cholesterol (see yours above)* E.   Diabetes Mellitus (higher risk if your is uncontrolled) F.  Family history of premature heart disease G.  Previous history of stroke or cardiovascular disease    *These are risk factors YOU HAVE CONTROL OVER.  For more information, visit .  There is now evidence that lowering the TOTAL CHOLESTEROL AND LDL CHOLESTEROL can reduce the risk of heart disease.  The American Heart Association recommends the following guidelines for the treatment of elevated cholesterol:  1.  If there is now current heart disease and less than two risk factors, TOTAL CHOLESTEROL should be less than 200 and LDL CHOLESTEROL should be less than 100. 2.  If there is current heart disease  or two or more risk factors, TOTAL CHOLESTEROL should be less than 200 and LDL CHOLESTEROL should be less than 70.  A diet low in cholesterol, saturated fat, and calories is the cornerstone of treatment for elevated cholesterol.  Cessation of smoking and exercise are also important in the management of elevated cholesterol and preventing vascular disease.  Studies have shown that 30 to 60 minutes of physical activity most days can help lower blood pressure, lower cholesterol, and keep your weight at a healthy level.  Drug therapy is used when cholesterol levels do not respond to therapeutic lifestyle changes (smoking cessation, diet, and exercise) and remains unacceptably high.  If medication is started, it is important to have you levels checked periodically to evaluate the need for further treatment options.  Thank you,    [employer] Team

## 2011-01-04 NOTE — Progress Notes (Signed)
Summary: Labs  Phone Note Call from Patient   Caller: Patient Call For: Clerance Lav MD Summary of Call: Call from pt -saw Dr. Jens Som who changed his medication.  Wants pt to return to PCP for labs in 6 weeks.  Pt will need a lipid panel as well as liver studies per office note. Angelina Ok RN  November 13, 2010 11:42 AM  Initial call taken by: Angelina Ok RN,  November 13, 2010 11:39 AM  Follow-up for Phone Call        pls provide appoinment accordingly.  Follow-up by: Clerance Lav MD,  November 20, 2010 8:14 PM

## 2011-01-12 ENCOUNTER — Ambulatory Visit (INDEPENDENT_AMBULATORY_CARE_PROVIDER_SITE_OTHER): Payer: Medicare Other | Admitting: Internal Medicine

## 2011-01-12 ENCOUNTER — Other Ambulatory Visit: Payer: Self-pay | Admitting: Internal Medicine

## 2011-01-12 ENCOUNTER — Encounter: Payer: Self-pay | Admitting: Internal Medicine

## 2011-01-12 VITALS — BP 148/101 | HR 87 | Temp 98.0°F | Ht 74.0 in | Wt 194.4 lb

## 2011-01-12 DIAGNOSIS — E1369 Other specified diabetes mellitus with other specified complication: Secondary | ICD-10-CM

## 2011-01-12 DIAGNOSIS — E119 Type 2 diabetes mellitus without complications: Secondary | ICD-10-CM

## 2011-01-12 DIAGNOSIS — E0865 Diabetes mellitus due to underlying condition with hyperglycemia: Secondary | ICD-10-CM

## 2011-01-12 DIAGNOSIS — E7800 Pure hypercholesterolemia, unspecified: Secondary | ICD-10-CM

## 2011-01-12 DIAGNOSIS — Z87898 Personal history of other specified conditions: Secondary | ICD-10-CM

## 2011-01-12 DIAGNOSIS — F17200 Nicotine dependence, unspecified, uncomplicated: Secondary | ICD-10-CM

## 2011-01-12 DIAGNOSIS — I10 Essential (primary) hypertension: Secondary | ICD-10-CM

## 2011-01-12 DIAGNOSIS — K219 Gastro-esophageal reflux disease without esophagitis: Secondary | ICD-10-CM

## 2011-01-12 LAB — COMPREHENSIVE METABOLIC PANEL
ALT: 37 U/L (ref 0–53)
AST: 22 U/L (ref 0–37)
Alkaline Phosphatase: 63 U/L (ref 39–117)
Sodium: 138 mEq/L (ref 135–145)
Total Bilirubin: 0.4 mg/dL (ref 0.3–1.2)
Total Protein: 7.6 g/dL (ref 6.0–8.3)

## 2011-01-12 LAB — GLUCOSE, POCT (MANUAL RESULT ENTRY): POC Glucose: 129

## 2011-01-12 MED ORDER — METFORMIN HCL 500 MG PO TABS: 500.0000 mg | ORAL_TABLET | ORAL | Status: DC

## 2011-01-12 MED ORDER — PANTOPRAZOLE SODIUM 40 MG PO TBEC: 40.0000 mg | DELAYED_RELEASE_TABLET | Freq: Every day | ORAL | Status: DC

## 2011-01-12 NOTE — Progress Notes (Signed)
  Subjective:    Patient ID: Timothy Yu, male    DOB: 1951/07/16, 60 y.o.   MRN: [id number]  HPI 60 years old male with PMH of GERD and chest pain secondary to CAD presents for follow up. He is having abdominal pain in epigastric region. He is not taking protonix for some time because he got better and now his symptoms have returned. He denies chest pain, nausea, vomiting and water brash. He denies hematemesis, melena or hematochezia.    Review of Systems  Constitutional: Negative for fever, chills, activity change, appetite change and fatigue.  HENT: Negative for ear pain, neck pain, neck stiffness and tinnitus.   Eyes: Negative for pain and redness.  Respiratory: Negative for apnea, cough and chest tightness.   Cardiovascular: Negative for chest pain.  Gastrointestinal: Negative for abdominal distention.  Genitourinary: Negative for urgency, flank pain and decreased urine volume.  Musculoskeletal: Negative for arthralgias.  Neurological: Positive for numbness.       Objective:   Physical Exam  Constitutional: He is oriented to person, place, and time. He appears well-developed and well-nourished. No distress.  HENT:  Head: Normocephalic and atraumatic.  Nose: Nose normal.  Eyes: Conjunctivae and EOM are normal. Pupils are equal, round, and reactive to light. Right eye exhibits no discharge. No scleral icterus.  Neck: No JVD present. No tracheal deviation present. No thyromegaly present.  Cardiovascular: Normal rate, regular rhythm and normal heart sounds.   No murmur heard. Pulmonary/Chest: No respiratory distress. He has no wheezes. He has no rales.  Abdominal: He exhibits no distension. There is tenderness. There is no rebound and no guarding.  Musculoskeletal: He exhibits no edema and no tenderness.  Neurological: He is alert and oriented to person, place, and time. He has normal reflexes. No cranial nerve deficit. Coordination normal.  Skin: Skin is warm. He is not  diaphoretic.          Assessment & Plan:

## 2011-01-12 NOTE — Assessment & Plan Note (Signed)
Excellent HDL and LDL. Continue current regimen.

## 2011-01-12 NOTE — Assessment & Plan Note (Signed)
No problem initiating urine and dribbling as before. Continue on flomax.

## 2011-01-12 NOTE — Assessment & Plan Note (Signed)
BP worsened. Unclear why it was that high today. Normally well controlled. Continue to monitor. If high on follow up visit, will change meds.

## 2011-01-12 NOTE — Assessment & Plan Note (Signed)
Diabetes fairly well controlled. Lab results today are pending. He is tolerating metformin at the current dose well.

## 2011-01-12 NOTE — Assessment & Plan Note (Signed)
Worsening of the symptoms due to cessation of PPI. Will restart it and reassess in one month.

## 2011-01-12 NOTE — Assessment & Plan Note (Signed)
Reduced but not able to completely quit. Encouraged cessation.

## 2011-01-12 NOTE — Patient Instructions (Signed)
Restart your protonix. Return in one month.  Monitor your BP and take your meds regularly.

## 2011-01-19 ENCOUNTER — Encounter: Payer: Self-pay | Admitting: Dietician

## 2011-02-26 LAB — GLUCOSE, CAPILLARY: Glucose-Capillary: 202 mg/dL — ABNORMAL HIGH (ref 70–99)

## 2011-03-11 LAB — BASIC METABOLIC PANEL
BUN: 12 mg/dL (ref 6–23)
CO2: 27 mEq/L (ref 19–32)
Calcium: 8.7 mg/dL (ref 8.4–10.5)
GFR calc non Af Amer: >60 mL/min (ref >60)
Glucose, Bld: 105 mg/dL — ABNORMAL HIGH (ref 70–99)

## 2011-03-11 LAB — CBC
HCT: 45.1 % (ref 39.0–52.0)
Hemoglobin: 15.2 g/dL (ref 13.0–17.0)
MCHC: 33.7 g/dL (ref 30.0–36.0)
Platelets: 358 10*3/uL (ref 150–400)
RDW: 14.3 % (ref 11.5–15.5)

## 2011-03-13 LAB — CK TOTAL AND CKMB (NOT AT ARMC)
CK, MB: 1.1 ng/mL (ref 0.3–4.0)
Relative Index: 0.9 (ref 0.0–2.5)
Total CK: 123 U/L (ref 7–232)

## 2011-03-13 LAB — BASIC METABOLIC PANEL
BUN: 18 mg/dL (ref 6–23)
Calcium: 9 mg/dL (ref 8.4–10.5)
Chloride: 105 mEq/L (ref 96–112)
Creatinine, Ser: 1.28 mg/dL (ref 0.4–1.5)

## 2011-03-13 LAB — POCT CARDIAC MARKERS
CKMB, poc: <1 ng/mL — ABNORMAL LOW (ref 1.0–8.0)
Myoglobin, poc: 59.4 ng/mL (ref 12–200)
Troponin i, poc: <0.05 ng/mL (ref 0.00–0.09)

## 2011-03-13 LAB — RAPID URINE DRUG SCREEN, HOSP PERFORMED
Barbiturates: NOT DETECTED
Benzodiazepines: NOT DETECTED

## 2011-03-13 LAB — COMPREHENSIVE METABOLIC PANEL
ALT: 18 U/L (ref 0–53)
AST: 29 U/L (ref 0–37)
Albumin: 3.7 g/dL (ref 3.5–5.2)
CO2: 28 mEq/L (ref 19–32)
Calcium: 9.5 mg/dL (ref 8.4–10.5)
Creatinine, Ser: 1.13 mg/dL (ref 0.4–1.5)
GFR calc Af Amer: >60 mL/min (ref >60)
Sodium: 140 mEq/L (ref 135–145)

## 2011-03-13 LAB — CARDIAC PANEL(CRET KIN+CKTOT+MB+TROPI)
CK, MB: 1 ng/mL (ref 0.3–4.0)
CK, MB: 1 ng/mL (ref 0.3–4.0)
Total CK: 109 U/L (ref 7–232)
Total CK: 89 U/L (ref 7–232)
Troponin I: 0.01 ng/mL (ref 0.00–0.06)
Troponin I: 0.01 ng/mL (ref 0.00–0.06)

## 2011-03-13 LAB — LIPID PANEL
Cholesterol: 146 mg/dL (ref 0–200)
HDL: 43 mg/dL (ref >39)
LDL Cholesterol: 78 mg/dL (ref 0–99)

## 2011-03-13 LAB — GLUCOSE, CAPILLARY
Glucose-Capillary: 123 mg/dL — ABNORMAL HIGH (ref 70–99)
Glucose-Capillary: 220 mg/dL — ABNORMAL HIGH (ref 70–99)

## 2011-03-13 LAB — PROTIME-INR: Prothrombin Time: 13 seconds (ref 11.6–15.2)

## 2011-03-13 LAB — TSH: TSH: 3.55 u[IU]/mL (ref 0.350–4.500)

## 2011-03-13 LAB — TROPONIN I: Troponin I: 0.01 ng/mL (ref 0.00–0.06)

## 2011-03-13 LAB — CBC
MCHC: 33.9 g/dL (ref 30.0–36.0)
MCHC: 34.1 g/dL (ref 30.0–36.0)
MCV: 90.7 fL (ref 78.0–100.0)
MCV: 91.4 fL (ref 78.0–100.0)
Platelets: 279 10*3/uL (ref 150–400)
Platelets: 305 10*3/uL (ref 150–400)
RBC: 5.27 MIL/uL (ref 4.22–5.81)
WBC: 6.1 10*3/uL (ref 4.0–10.5)
WBC: 6.6 10*3/uL (ref 4.0–10.5)

## 2011-03-15 ENCOUNTER — Encounter: Payer: Self-pay | Admitting: Internal Medicine

## 2011-03-15 ENCOUNTER — Ambulatory Visit (INDEPENDENT_AMBULATORY_CARE_PROVIDER_SITE_OTHER): Payer: Medicare Other | Admitting: Internal Medicine

## 2011-03-15 VITALS — BP 150/87 | HR 59 | Temp 97.9°F | Ht 74.0 in | Wt 197.1 lb

## 2011-03-15 DIAGNOSIS — E119 Type 2 diabetes mellitus without complications: Secondary | ICD-10-CM

## 2011-03-15 DIAGNOSIS — E7800 Pure hypercholesterolemia, unspecified: Secondary | ICD-10-CM

## 2011-03-15 DIAGNOSIS — I2510 Atherosclerotic heart disease of native coronary artery without angina pectoris: Secondary | ICD-10-CM

## 2011-03-15 DIAGNOSIS — I10 Essential (primary) hypertension: Secondary | ICD-10-CM

## 2011-03-15 MED ORDER — HYDROCHLOROTHIAZIDE 12.5 MG PO TABS: 12.5000 mg | ORAL_TABLET | Freq: Every day | ORAL | Status: DC

## 2011-03-15 MED ORDER — SIMVASTATIN 40 MG PO TABS: 40.0000 mg | ORAL_TABLET | Freq: Every day | ORAL | Status: DC

## 2011-03-15 NOTE — Progress Notes (Signed)
  Subjective:    Patient ID: Timothy Yu, male    DOB: Feb 05, 1951, 60 y.o.   MRN: [id number]  HPI 60 year old male with past records show coronary artery disease, diabetes, hypertension presents for followup. He has no symptoms. He has lost his very close friend recently with massive heart attack.  On his last visit he was noted to have hypertension which is unusual for him. He has had been well controlled therefore I have asked him to come back once more. He also requests that he gets his medication from mail-order system which cost him $0. Review of Systems  All other systems reviewed and are negative.       Objective:   Physical Exam BP 150/87  Pulse 59  Temp 97.9 F (36.6 C)  Ht 6\' 2"  (1.88 m)  Wt 197 lb 1.6 oz (89.404 kg)  BMI 25.31 kg/m2  General Appearance:    Alert, cooperative, no distress, appears stated age  Head:    Normocephalic, without obvious abnormality, atraumatic  Eyes:    PERRL, conjunctiva/corneas clear, EOM's intact, fundi    benign, both eyes       Ears:    Normal TM's and external ear canals, both ears  Nose:   Nares normal, septum midline, mucosa normal, no drainage   or sinus tenderness  Throat:   Lips, mucosa, and tongue normal; teeth and gums normal  Neck:   Supple, symmetrical, trachea midline, no adenopathy;       thyroid:  No enlargement/tenderness/nodules; no carotid   bruit or JVD  Back:     Symmetric, no curvature, ROM normal, no CVA tenderness  Lungs:     Clear to auscultation bilaterally, respirations unlabored  Chest wall:    No tenderness or deformity  Heart:    Regular rate and rhythm, S1 and S2 normal, no murmur, rub   or gallop  Abdomen:     Soft, non-tender, bowel sounds active all four quadrants,    no masses, no organomegaly  Extremities:   Extremities normal, atraumatic, no cyanosis or edema  Pulses:   2+ and symmetric all extremities  Skin:   Skin color, texture, turgor normal, no rashes or lesions  Lymph nodes:   Cervical,  supraclavicular, and axillary nodes normal  Neurologic:   CNII-XII intact. Normal strength, sensation and reflexes      throughout          Assessment & Plan:

## 2011-03-15 NOTE — Assessment & Plan Note (Signed)
Patient is unable to afford Crestor with mail order pharmacy costing $120. I have chosen simvastatin which is available to him for free. Repeat lipid panel in 6 weeks to make sure that his LDL is below 70.

## 2011-03-15 NOTE — Patient Instructions (Signed)
Start taking your new cholesterol and BP pills. If you have difficulty getting them filled, call us.

## 2011-03-15 NOTE — Assessment & Plan Note (Signed)
Stable. No chest pain.

## 2011-03-15 NOTE — Assessment & Plan Note (Signed)
On metformin. He is well controlled his A1c is within target no further management required at this time.

## 2011-03-15 NOTE — Assessment & Plan Note (Signed)
In past not tolerant to lisinopril. I will start him on HCTZ 12.5 mg and see how he responds to it. Continue to monitor BMET thereafter for hypokalemia

## 2011-03-16 ENCOUNTER — Other Ambulatory Visit: Payer: Self-pay | Admitting: *Deleted

## 2011-03-16 MED ORDER — METOPROLOL SUCCINATE ER 50 MG PO TB24: 50.0000 mg | ORAL_TABLET | Freq: Every day | ORAL | Status: DC

## 2011-03-19 ENCOUNTER — Other Ambulatory Visit: Payer: Self-pay | Admitting: *Deleted

## 2011-03-19 DIAGNOSIS — E0865 Diabetes mellitus due to underlying condition with hyperglycemia: Secondary | ICD-10-CM

## 2011-03-19 MED ORDER — METFORMIN HCL 500 MG PO TABS: 500.0000 mg | ORAL_TABLET | ORAL | Status: DC

## 2011-03-25 ENCOUNTER — Encounter: Payer: Self-pay | Admitting: Internal Medicine

## 2011-04-17 NOTE — Cardiovascular Report (Signed)
NAME:  Timothy Yu, Timothy Yu             ACCOUNT NO.:  [id number]   MEDICAL RECORD NO.:  [id number]          PATIENT TYPE:  INP   LOCATION:  2503                         FACILITY:  [facility]   PHYSICIAN:  Verne Carrow, MDDATE OF BIRTH:  [birth year]   DATE OF PROCEDURE:  06/24/2009  DATE OF DISCHARGE:                            CARDIAC CATHETERIZATION   PRIMARY CARDIOLOGIST:  Madolyn Frieze. Jens Som, MD, [facility]   PRIMARY CARE PHYSICIAN:  Alvester Morin, MD   PERFORMING PHYSICIAN:  Verne Carrow, MD   PROCEDURE PERFORMED:  1. Left heart catheterization.  2. Selective coronary angiography.  3. Left ventricular angiogram.  4. Percutaneous coronary intervention with placement of a bare-metal      stent in the mid right coronary artery.  5. Placement of an Angio-Seal femoral artery closure device.   INDICATIONS:  Chest pain in a 60 year old patient with a history of  diabetes mellitus, hypertension and tobacco abuse.  The patient  underwent a myocardial perfusion stress study which showed inferior wall  ischemia.  Diagnostic left heart catheterization was arranged as an  outpatient today.   PROCEDURE IN DETAIL:  The patient was brought to the main cardiac  catheterization laboratory as an outpatient after signing informed  consent for the procedure.  The right groin was prepped and draped in  sterile fashion.  Lidocaine 1% was used for local anesthesia.  A 5-  French sheath was inserted into the right femoral artery without  difficulty.  Standard diagnostic catheters were used to perform  selective coronary angiography.  A pigtail catheter was used to perform  a left ventricular angiogram.  At this point of the procedure, we  elected to proceed to intervention of the 95% stenosis in the mid right  coronary artery.  The sheath was changed to a 6-French sheath.  The  patient was given a bolus of Angiomax and a drip was started.  The  patient was given 324 mg of aspirin, as well as 600  mg of Plavix on the  cath table.  Once the ACT was greater than 200, we selectively engaged  the right coronary artery with a JR-4 guiding catheter.  A cougar  intracoronary wire was passed down the right coronary artery without  difficulty.  A 2.5 x 12-mm balloon was inflated in the area of tightest  stenosis.  A 2.75 x 14-mm Driver bare-metal stent was deployed in the  area of tightest stenosis in the mid right coronary artery.  A 3.0 x 12-  mm noncompliant balloon was then inflated inside the stent to 15  atmospheres which gave [place] a diameter of 3.18 mm.  Flow both before and  after the intervention was TIMI-III.  The stenosis was 95% for the  intervention and 0% at the end of the case.  The patient tolerated the  procedure well.  Angiomax was stopped in the cath lab.  He was taken to  the holding area in stable condition.   HEMODYNAMIC FINDINGS:  Central aortic pressure 120/68, left ventricular  pressure 140/5, left ventricular end-diastolic pressure 6.   ANGIOGRAPHIC FINDINGS:  1. The left main coronary artery  had no evidence of disease.  2. Left anterior descending artery is a large vessel that courses to      the apex and wraps around the apex.  It gives off two diagonal      branches.  There is a 30-40% stenosis in the proximal portion of      the LAD.  There is a tubular 50% stenosis in the midportion of the      vessel.  There is diffuse disease throughout the distal vessel.      The apical LAD portion is small after it wraps around the apex and      has serial 40% lesions.  The first diagonal is a small to moderate-      sized bifurcating vessel that has a 65% stenosis in the middle      portion of the vessel at the bifurcation.  The second diagonal is      small and has no significant disease.  3. The circumflex artery is a moderate-to-large sized vessel that      gives off two obtuse marginal branches.  There are serial 30%      lesions throughout the proximal and  midportion of the circumflex.      The first obtuse marginal is moderate-to-large size and has serial      30% lesions.  Second obtuse marginal is moderate size bifurcating      vessel that has a 50-60% stenosis at the branch point in the      midportion of the vessel.  4. The right coronary artery is a dominant vessel that has a proximal      20% stenosis, mid 95% stenosis and a long tubular 50% stenosis in      the distal portion.  This gives rise to a posterior descending      artery and a posterolateral branch.  There appears to be a 40%      stenosis in the ostium of the posterolateral branch.  5. Left ventricular angiogram was performed in the RAO projection and      shows normal left ventricular systolic function with no wall motion      abnormalities.  Ejection fraction was estimated at 60%.   IMPRESSION:  1. Moderate nonobstructive disease in the left anterior descending and      circumflex systems.  2. Severe stenosis in the mid right coronary artery.  3. Normal left ventricular systolic function.  4. Successful percutaneous coronary intervention with placement of a      bare-metal stent in the mid right coronary artery.   RECOMMENDATIONS:  The patient should be continued on aspirin 325 mg once  daily and Plavix 75 mg once daily for at least 1 month.  The patient  does have some financial concerns and because of this we elected to  place a bare-metal stent.  The patient will need a Plavix card for 2  weeks before discharge and paperwork filled out for the Plavix  assistance program.  We will start him on a beta-blocker and a statin  today.  He will be continued on his ACE inhibitor and aspirin.  He will  need generic prescriptions for his beta-blocker and statin prior to  discharge.  He will be watched overnight and if stable tomorrow will be  discharged to home.      Verne Carrow, MD  Electronically Signed     CM/MEDQ  D:  06/24/2009  T:  06/25/2009  Job:   [id number]  cc:   Madolyn Frieze. Jens Som, MD, [facility]  Alvester Morin, M.D.

## 2011-04-17 NOTE — Discharge Summary (Signed)
NAME:  Timothy Yu, Timothy Yu             ACCOUNT NO.:  [id number]   MEDICAL RECORD NO.:  [id number]          PATIENT TYPE:  INP   LOCATION:  2503                         FACILITY:  [facility]   PHYSICIAN:  Gerrit Friends. Dietrich Pates, MD, FACCDATE OF BIRTH:  05-13-1951   DATE OF ADMISSION:  06/24/2009  DATE OF DISCHARGE:  06/25/2009                               DISCHARGE SUMMARY   PROCEDURES:  1. Cardiac catheterization.  2. Coronary arteriogram.  3. Left ventriculogram.  4. Percutaneous transluminal coronary angioplasty and bare-metal stent      to the mid right coronary artery.   PRIMARY FINAL DISCHARGE DIAGNOSIS:  Unstable anginal pain.   SECONDARY DIAGNOSES:  1. Diabetes.  2. Hypertension.  3. History of epigastric pain.  4. Ongoing tobacco use.  5. Family history of coronary artery disease in his sister.   TIME OF DISCHARGE:  36 minutes.   HOSPITAL COURSE:  [name] is a 60 year old male with a previous  history of chest pain.  He had a mildly abnormal Myoview and medical  therapy was recommended.  He was evaluated and there was concern for  unstable anginal pain, so cardiac catheterization was recommended and he  came to the hospital for the procedure on June 24, 2009.   The cardiac catheterization showed 95% mid RCA which was treated with  PTCA and a bare-metal stent reducing the stenosis to 0.  He had residual  coronary artery disease in the LAD at 40 and 50% and in the first  diagonal with a 65% stenosis.  The circumflex had a 30%, OM-1 30% and OM-  2 bifurcating 50-60%, RCA had a 40% stenosis at the PL ostium.  His EF  was 60% with no wall motion abnormalities.   Dr. Clifton James placed a bare-metal stent secondary to financial concerns  and his inability to afford long-term Plavix.  He was started on a beta-  blocker and statin with generic medications recommended.   On June 25, 2009, he was seen by Cardiac Rehab.  He was educated  regarding cardiac risk factor reduction and  smoking cessation.  He did  well with ambulation and had no chest pain.  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  This was repeated on June 25, 2009.  The  right upper lobe airspace opacity was unchanged.  The radiologist was  contacted who stated that this could be atelectasis or pneumonitis and  recommended treating upper respiratory symptoms.  As the patient had no  cough, no fever, and his white count was within normal limits, no  antibiotics are indicated at this time.  On June 25, 2009, [name]  was evaluated by Dr. Dietrich Pates and considered stable for discharge with  outpatient followup arranged.   DISCHARGE INSTRUCTIONS:  His activity level is to be increased  gradually.  He is to call our office for problems with the cath site.  He is to follow up with Dr. Jens Som and our office will contact him.  He is to follow up with Dr. Sherryll Burger as needed.   DISCHARGE MEDICATIONS:  Listed on the automatic discharge summary.  Of  note, metformin 500 mg daily was put on hold, but the patient was  advised to restart it in 48 hours.      Theodore Demark, PA-C      Gerrit Friends. Dietrich Pates, MD, [facility]  Electronically Signed    RB/MEDQ  D:  06/25/2009  T:  06/26/2009  Job:  [id number]   cc:   Kirstie Peri, MD

## 2011-04-17 NOTE — Discharge Summary (Signed)
NAME:  Timothy Yu, Timothy Yu             ACCOUNT NO.:  [id number]   MEDICAL RECORD NO.:  [id number]          PATIENT TYPE:  INP   LOCATION:  2011                         FACILITY:  [facility]   PHYSICIAN:  Alvester Morin, M.D.  DATE OF BIRTH:  1951/02/14   DATE OF ADMISSION:  04/18/2009  DATE OF DISCHARGE:  04/19/2009                               DISCHARGE SUMMARY   DISCHARGE DIAGNOSES:  1. Epigastric pain unknown etiology.  2. Subjective weight loss 20 pounds for last 6 months.  3. Exertional chest pain.  4. Hypertension.  5. Tobacco abuse.   DISCHARGE MEDICATIONS WITH ACCURATE DOSAGE:  1. Aspirin 81 mg once a day.  2. Nitroglycerin 0.4 mg sublingually as needed for chest pain.  3. Metformin 500 mg twice a day.  4. Famotidine 20 mg once a day.   DISPOSITION AND FOLLOWUP:  1. The patient is to follow up with Dr. Jens Som at [facility]      on May 03, 2009, at 11:30 a.m.  2. The patient is to follow up with Dr. Marina Goodell at [facility] at 9:15 a.m. on May 12, 2009.  3. The patient is to follow up with Dr. Sherryll Burger at [facility] on      May 03, 2009, at 4 p.m. on the ground floor of the [facility].  4. The patient advised on smoking cessation and to avoid foods high in      acids and to not eat prior to lying down or exercise.   PROCEDURES PERFORMED:  Chest x-ray on Apr 18, 2009, no acute thoracic  findings were identified.   CONSULTATIONS:  None.   ADMITTING HISTORY AND PHYSICAL:  The patient is a 60 year old African  American male with past medical history of diabetes and hypertension  reportedly diagnosed a year prior with intermittent treatment who  presents with complaint of retrosternal and epigastric pain.  The  patient's pain is described as intermittent, burning, or squeezing in  nature with 9/10, not radiating or associated with nausea, vomiting, or  diaphoresis.  The patient reports that the patient experienced last  episode night  prior to arrival.  The patient first experienced pain in  right lower quadrant of abdomen, which then moved to the epigastric  area. The patient denies any recent ibuprofen use.  The patient also  reports on and off retrosternal chest pain that he started experiencing  2 years prior.  The chest pain would come with exertion at least once  every week following yard work or grocery shopping.  The pain was  investigated by cardiologist per patient while he was living in [place].  The patient recently moved to [place] about a year ago from [place].  The patient as far as can remember no coronary artery disease was found.  We do not have any reports available at the time of admission.  The  patient's chest pain has become more frequent and sometimes also related  to the epigastric area.  The patient now reports that this epigastric or  chest pain appear without any  exertion or without any relationship with  eating.  The patient does not report any fevers, chills, night sweats,  diarrhea, constipation, abdominal pain other than previously described.  The patient does not report any urinary frequency, urgency, or  hesitancy.  The patient does not report any lumps or bumps felt  throughout the body.  The patient does not report any loss of  consciousness, headache, visual problem, or speech difficulty.   PHYSICAL EXAMINATION:  VITAL SIGNS:  Temperature 98, blood pressure  124/62, pulse of 69, respiratory rate 19, and O2 saturation 100% on room  air.  GENERAL:  The patient is a pleasant man without in any acute distress.  HEENT:  EOMI.  PERRLA.  Anicteric.  No tonsillar enlargement or exudate.  Oropharyngeal mucosa pink and moist.  NECK:  Supple.  Full range of motion.  No thyromegaly.  RESPIRATORY:  Bilateral good air movements.  Clear to auscultation.  No  wheezes, rales, or rhonchi.  CARDIOVASCULAR:  S1 and S2 normal.  Regurg normal.  Regular rate and  rhythm.  ABDOMEN:  Bowel sounds are  present.  Abdomen is soft and nontender  without any guarding or rigidity.  EXTREMITIES:  Good peripheral pulses without any edema.  No cyanosis.  No tenderness.  SKIN:  No rashes. No cynosis.  NEUROLOGIC:  Alert and oriented x3.  Cranial nerves II through XII  intact.  No focal, motor, or sensory deficit.  Motor strength is 5/5 in  all extremities. Cerebellar sign negative.  PSYCH:  Appropriate.   LABORATORY DATA:  On admission, sodium 140, potassium 4.0, chloride 106,  bicarb of 28, BUN of 17, creatinine 1.13, platelet count of 122,  bilirubin of 0.5, alkaline phosphate 56, AST 29, ALT 18, protein 7.3,  albumin 3.7, and calcium 1.5.  Hemoglobin of 16.2, white count of 6.1,  platelets 305, with MCV of 90.  Cardiac enzymes were negative.  Cardiac  enzymes x2 negative.  Lipase was 57, cholesterol 146, LDL 78, HDL 43,  and triglyceride 045.   HOSPITAL COURSE BY PROBLEM:  1. Epigastric pain.  The patient's epigastric pain well controlled.      The patient's epigastric pain could be secondary to GERD or some      gastric pathology.  The patient was kept n.p.o. and provided      Protonix intravenously for the first day of admission.  The      patient, however, was quickly tolerating his food and did not have      subsequent epigastric pain. Patient on discharge provided      famotidine as he can not afford protonix.  2. Subjective weight loss of 20 pounds.  The patient reports      subjective weight loss of 20 pounds over the last 6 months.  The      patient also reports that he had some sort of gastric study that      revealed a tumor.  However, no reports of particular study or      findings were available.  Medical records were obtained from [facility] which had some of the records available from patients primary      care Aralyn Nowak from [place].  Review of this records did not reveal      any significant history of gastric cancer.  The patient would now      follow up with Dr.  Marina Goodell and would be advised on further course of  action to treat or investigate possible GI pathology.  3. Exertional chest pain.  The patient also reports exertional chest      pain.  The patient's chest pain has now also been happening at bed      rest.  The patient reports exercise stress test done in [place] 2      years prior.  The patient reports that the study did not reveal any      coronary artery disease. However, no documentary evidence is      available. The patient has risk factors in terms of smoking for 1      pack per day for last 30 years.  The patient is male above 33.  The      patient has a history of diabetes. The patient would be further      managed by Dr. Jens Som at the [facility] upon outpatient      visit that is scheduled in the first week of June 2010.  The      patient's lipid profile at the time of discharge was cholesterol      146, LDL 78, HDL 43, and triglyceride 123.  His LDL was below the      target value.  The patient was not started on any lipid lowering      medication.  4. Hypertension.  The patient's blood pressure during this admission      remained normal.  His systolic blood pressure was never found to be      above 140.  No antihypertensive medications were started.  The      patient will be further followed up on an outpatient basis for      possible hypertension.  5. Tobacco abuse.  The patient was provided smoking cessation      counseling.  The patient's increase risk of coronary artery disease      as well as irritation of esophagus and gastro secondary to smoking      were discussed.  The patient was provided information about tobacco      cessation.  The patient was provided nicotine patch during this      hospitalization.   The patient's vital signs at the time of discharge were as following,  temperature 97.6, blood pressure 122/71, pulse of 60, respiratory rate  of 20, and O2 saturation of 98% on room air.       Clerance Lav, MD PhD  Electronically Signed      Alvester Morin, M.D.  Electronically Signed    RS/MEDQ  D:  04/21/2009  T:  04/22/2009  Job:  [id number]   cc:   Wilhemina Bonito. Marina Goodell, MD  Madolyn Frieze. Jens Som, MD, [facility]

## 2011-06-28 ENCOUNTER — Encounter: Payer: Self-pay | Admitting: Internal Medicine

## 2011-06-28 ENCOUNTER — Ambulatory Visit (INDEPENDENT_AMBULATORY_CARE_PROVIDER_SITE_OTHER): Payer: Medicare HMO | Admitting: Internal Medicine

## 2011-06-28 VITALS — BP 128/82 | HR 86 | Temp 97.3°F | Resp 20 | Ht 73.0 in | Wt 188.2 lb

## 2011-06-28 DIAGNOSIS — I2510 Atherosclerotic heart disease of native coronary artery without angina pectoris: Secondary | ICD-10-CM

## 2011-06-28 DIAGNOSIS — E119 Type 2 diabetes mellitus without complications: Secondary | ICD-10-CM

## 2011-06-28 DIAGNOSIS — I10 Essential (primary) hypertension: Secondary | ICD-10-CM

## 2011-06-28 DIAGNOSIS — G4700 Insomnia, unspecified: Secondary | ICD-10-CM

## 2011-06-28 DIAGNOSIS — R252 Cramp and spasm: Secondary | ICD-10-CM | POA: Insufficient documentation

## 2011-06-28 DIAGNOSIS — N400 Enlarged prostate without lower urinary tract symptoms: Secondary | ICD-10-CM

## 2011-06-28 DIAGNOSIS — R209 Unspecified disturbances of skin sensation: Secondary | ICD-10-CM

## 2011-06-28 DIAGNOSIS — R200 Anesthesia of skin: Secondary | ICD-10-CM | POA: Insufficient documentation

## 2011-06-28 DIAGNOSIS — Z0000 Encounter for general adult medical examination without abnormal findings: Secondary | ICD-10-CM

## 2011-06-28 DIAGNOSIS — R079 Chest pain, unspecified: Secondary | ICD-10-CM

## 2011-06-28 DIAGNOSIS — K219 Gastro-esophageal reflux disease without esophagitis: Secondary | ICD-10-CM

## 2011-06-28 DIAGNOSIS — E7800 Pure hypercholesterolemia, unspecified: Secondary | ICD-10-CM

## 2011-06-28 LAB — GLUCOSE, CAPILLARY: Glucose-Capillary: 114 mg/dL — ABNORMAL HIGH (ref 70–99)

## 2011-06-28 MED ORDER — METFORMIN HCL 500 MG PO TABS: 1500.0000 mg | ORAL_TABLET | Freq: Every day | ORAL | Status: DC

## 2011-06-28 MED ORDER — TAMSULOSIN HCL 0.4 MG PO CAPS: 0.4000 mg | ORAL_CAPSULE | Freq: Every day | ORAL | Status: DC

## 2011-06-28 MED ORDER — METOPROLOL SUCCINATE ER 50 MG PO TB24: 50.0000 mg | ORAL_TABLET | Freq: Every day | ORAL | Status: DC

## 2011-06-28 MED ORDER — ZOSTER VACCINE LIVE 19400 UNT/0.65ML ~~LOC~~ SOLR: 0.6500 mL | Freq: Once | SUBCUTANEOUS | Status: DC

## 2011-06-28 MED ORDER — ESZOPICLONE 2 MG PO TABS: 2.0000 mg | ORAL_TABLET | Freq: Every day | ORAL | Status: DC

## 2011-06-28 MED ORDER — SIMVASTATIN 40 MG PO TABS: 40.0000 mg | ORAL_TABLET | Freq: Every day | ORAL | Status: DC

## 2011-06-28 MED ORDER — CILOSTAZOL 50 MG PO TABS: 50.0000 mg | ORAL_TABLET | Freq: Two times a day (BID) | ORAL | Status: DC

## 2011-06-28 MED ORDER — PANTOPRAZOLE SODIUM 40 MG PO TBEC: 40.0000 mg | DELAYED_RELEASE_TABLET | Freq: Every day | ORAL | Status: DC

## 2011-06-28 MED ORDER — ONE-DAILY MULTI VITAMINS PO TABS: 1.0000 | ORAL_TABLET | Freq: Every day | ORAL | Status: AC

## 2011-06-28 MED ORDER — ASPIRIN 325 MG PO TBEC: 325.0000 mg | DELAYED_RELEASE_TABLET | Freq: Every day | ORAL | Status: DC

## 2011-06-28 MED ORDER — HYDROCHLOROTHIAZIDE 12.5 MG PO TABS: 12.5000 mg | ORAL_TABLET | Freq: Every day | ORAL | Status: DC

## 2011-06-28 NOTE — Assessment & Plan Note (Signed)
Area of subjectively decreased sensation on L medial foot, possibly due to ?nerve injury during a hip joint injection -given the fact that the numbness is unchanged over 4-5 months, effects are likely permanent -gabapentin has not relieved his symptoms, and patient does not want to try an increased dose at this time -symptoms do not interfere with patient's daily functioning, and patient reassured that this represents a benign finding

## 2011-06-28 NOTE — Progress Notes (Signed)
Follow-up Visit  HPI The patient is a 60 yo man with a history of DM, HL, depression, HTN, CAD s/p RCA stent 2010, and BPH, presenting for a routine follow-up.  The patient notes a 4-5 month history of left medical foot numbness, with numbness extending from his 1st and 2nd toe to his ankle, which is present at all times.  He first developed these symptoms after a cortisone shot to his hip about 5-6 months ago, and the symptoms have not changed.  He has tried gabapentin since his last visit for the numbness, with no success.  He also notes a 4-5 month history of bilateral foot and leg cramps, describing them as a sudden, painful, squeezing feeling that can occur when he moves his leg in a certain manner, occurring 2-3 times/day.  The symptoms can occur with exertion or at rest, and they slowly dissipate with time, or with a tissue massage.  The patient also notes a new complaint of chest pain, described as a sharp, left-sided pain that occurs 1-2 times/week, lasting 15-20 mins, does not radiate, and is worsened by taking a deep breath.  During these symptoms, he notes no SOB, dyspnea, diaphoresis, or nausea/vomiting, and the symptoms are neither triggered nor changed by exertion.  The patient follows with his cardiologist regularly, and has a history of a bare metal stent placed in the RCA in 2010, because of mild inferior ischemia seen on myoview.  He notes that his present chest pain is "nothing like" the chest pain preceding stent placement.  The patient is short of breath after walking 1 block at baseline.  ROS: General: +9-lb weight loss since the last visit, but he notes that his weight usually falls within his present range, no fevers, chills, changes in appetite Skin: no rash HEENT: no blurry vision, hearing changes, sore throat Pulm: no dyspnea, coughing, wheezing CV: see HPI, palpitations, shortness of breath Abd: no abdominal pain, nausea/vomiting, diarrhea/constipation GU: no dysuria,  hematuria, polyuria Ext: no arthralgias, myalgias Neuro: see HPI, no weakness, or tingling  Filed Vitals:   06/28/11 1341  BP: 146/85  Pulse: 86  Temp: 97.3 F (36.3 C)  Resp: 20    PEX General: alert, cooperative, and in no apparent distress HEENT: pupils equal round and reactive to light, vision grossly intact, oropharynx clear and non-erythematous  Neck: supple, no lymphadenopathy, JVD, or carotid bruits Lungs: clear to ascultation bilaterally, normal work of respiration, no wheezes, rales, ronchi Heart: regular rate and rhythm, no murmurs, gallops, or rubs Abdomen: soft, non-tender, non-distended, normal bowel sounds Msk: no joint edema, warmth, or erythema Extremities: no cyanosis, clubbing, or edema Neurologic: alert & oriented X3, cranial nerves II-XII intact, strength grossly intact, sensation to pinprick intact in feet and legs bilaterally, sensation to light tough subjectively diminished in left 1st and 2nd toes and medial foot to the level of the ankle.  Assessment/Plan

## 2011-06-28 NOTE — Assessment & Plan Note (Signed)
Patient started on HCTZ 12.5 mg at last visit, in addition to Metoprolol XL 50 mg.  BP well-controlled today -continue current regimen

## 2011-06-28 NOTE — Assessment & Plan Note (Addendum)
Currently well-controlled on Metformin, 1500mg /day (of note, patient finds it easier to take all 1500 mg at one time) -Hb A1C = 6.6 today -continue current regimen -will check urine microalbumin today

## 2011-06-28 NOTE — Assessment & Plan Note (Signed)
Patient's current chest pain appears musculoskeletal in origin.  Patient is not currently in pain today, and physical exam is normal.  Despite patient's extensive cardiac history, chest pain does not appear to be cardiac in origin given patient's description of his localized, non-radiating, non-exertional pain, occurring infrequently, with no associated symptoms, and with a change in the pain with deep breathing.  Chest pain is unlikely due to PE, given lack of dyspnea/SOB during episodes, and stable, recurrent episodes occurring over the last 3 months. -chest pain does not interfere with patient's daily functioning -patient re-assured about benign nature of his pain

## 2011-06-28 NOTE — Assessment & Plan Note (Signed)
Patient's symptoms likely represent occasional physiologic leg cramps.  His symptoms are not consistent with claudication, given their sudden and severe onset, no relation to exertion, onset with moving his leg in a certain position, and lack of immediate relief with rest. -patient was educated about ways to avoid getting cramps, including drinking plenty of water

## 2011-06-28 NOTE — Patient Instructions (Signed)
We have sent all of your prescriptions to your mail-order pharmacy -we have discontinued your ibuprofin, as this may increase your stomach pain from acid reflux  We are checking some basic lab work today, and will call you with any abnormal results  Please return for a follow-up visit in 3 months

## 2011-06-28 NOTE — Assessment & Plan Note (Signed)
-  Patient declines colonoscopy at this time, agrees to [facility] -Prescription given for Zostavax

## 2011-06-29 LAB — COMPLETE METABOLIC PANEL WITH GFR
Alkaline Phosphatase: 51 U/L (ref 39–117)
CO2: 26 mEq/L (ref 19–32)
Creat: 1.23 mg/dL (ref 0.50–1.35)
GFR, Est African American: >60 mL/min (ref >60)
GFR, Est Non African American: 60 mL/min — ABNORMAL LOW (ref >60)
Glucose, Bld: 95 mg/dL (ref 70–99)
Sodium: 140 mEq/L (ref 135–145)
Total Bilirubin: 0.4 mg/dL (ref 0.3–1.2)

## 2011-06-29 LAB — MICROALBUMIN / CREATININE URINE RATIO
Creatinine, Urine: 149.3 mg/dL
Microalb Creat Ratio: 8.8 mg/g (ref 0.0–30.0)

## 2011-06-29 NOTE — Progress Notes (Signed)
Agree with plans and notes. 

## 2011-07-05 ENCOUNTER — Other Ambulatory Visit: Payer: Medicare HMO

## 2011-08-22 ENCOUNTER — Telehealth: Payer: Self-pay | Admitting: Internal Medicine

## 2011-08-22 NOTE — Telephone Encounter (Signed)
Pt had been called by a medical equipment company for him to ask for a new meter that he would not have to draw blood to test his sugar. I told him that no such meter exists.  His present glucometer works fine. He did request refill for new strips today which I called in.

## 2011-08-30 ENCOUNTER — Other Ambulatory Visit: Payer: Self-pay | Admitting: Internal Medicine

## 2011-09-27 ENCOUNTER — Encounter: Payer: Medicare HMO | Admitting: Internal Medicine

## 2011-10-16 ENCOUNTER — Encounter: Payer: Self-pay | Admitting: Cardiology

## 2011-10-18 ENCOUNTER — Encounter: Payer: Self-pay | Admitting: Cardiology

## 2011-10-18 ENCOUNTER — Ambulatory Visit (INDEPENDENT_AMBULATORY_CARE_PROVIDER_SITE_OTHER): Payer: Medicare HMO | Admitting: Cardiology

## 2011-10-18 DIAGNOSIS — E7800 Pure hypercholesterolemia, unspecified: Secondary | ICD-10-CM

## 2011-10-18 DIAGNOSIS — I10 Essential (primary) hypertension: Secondary | ICD-10-CM

## 2011-10-18 DIAGNOSIS — F17200 Nicotine dependence, unspecified, uncomplicated: Secondary | ICD-10-CM

## 2011-10-18 DIAGNOSIS — I2510 Atherosclerotic heart disease of native coronary artery without angina pectoris: Secondary | ICD-10-CM

## 2011-10-18 NOTE — Assessment & Plan Note (Signed)
Continue aspirin and statin. Plan followup functional study when he returns in one year.

## 2011-10-18 NOTE — Assessment & Plan Note (Signed)
Continue statin. Lipids and liver monitored by primary care. 

## 2011-10-18 NOTE — Assessment & Plan Note (Signed)
Blood pressure controlled. Continue present medications. Potassium and renal function monitored by primary care. 

## 2011-10-18 NOTE — Progress Notes (Signed)
WUJ:[id number] gentleman for fu of CAD. A Myoview in June 2010 showed inferior ischemia that was mild. Ejection fraction was 51%. Because of his persistent symptoms we scheduled a cardiac catheterization. This was performed on June 24, 2009. It showed normal LV function. There was moderate disease in the circumflex and LAD. However there was severe stenosis in the mid right coronary artery at 95%. The patient subsequently had a bare-metal stent to the right coronary artery. I last saw him in Nov of 2011. Since then the patient has dyspnea with more extreme activities but not with routine activities. It is relieved with rest. It is not associated with chest pain. There is no orthopnea, PND or pedal edema. There is no syncope or palpitations. There is no exertional chest pain.  Current Outpatient Prescriptions  Medication Sig Dispense Refill  . aspirin 325 MG EC tablet Take 1 tablet (325 mg total) by mouth daily.  90 tablet  3  . cilostazol (PLETAL) 50 MG tablet Take 1 tablet (50 mg total) by mouth 2 (two) times daily.  180 tablet  3  . eszopiclone (LUNESTA) 2 MG TABS Take 1 tablet (2 mg total) by mouth at bedtime. Take immediately before bedtime  90 tablet  1  . gabapentin (NEURONTIN) 100 MG capsule Take 100 mg by mouth at bedtime.        . hydrochlorothiazide (HYDRODIURIL) 12.5 MG tablet Take 1 tablet (12.5 mg total) by mouth daily.  90 tablet  3  . metFORMIN (GLUCOPHAGE) 500 MG tablet Take 3 tablets (1,500 mg total) by mouth daily with breakfast.  270 tablet  3  . metoprolol (TOPROL-XL) 50 MG 24 hr tablet Take 1 tablet (50 mg total) by mouth daily.  90 tablet  3  . Multiple Vitamin (MULTIVITAMIN) tablet Take 1 tablet by mouth daily.  90 tablet  3  . nitroGLYCERIN (NITROSTAT) 0.4 MG SL tablet Place 0.4 mg under the tongue every 5 (five) minutes as needed. For chest pain.       . pantoprazole (PROTONIX) 40 MG tablet Take 1 tablet (40 mg total) by mouth daily.  90 tablet  3  . simvastatin (ZOCOR) 40 MG  tablet Take 1 tablet (40 mg total) by mouth at bedtime.  90 tablet  3  . Tamsulosin HCl (FLOMAX) 0.4 MG CAPS Take 1 capsule (0.4 mg total) by mouth daily.  90 capsule  3  . zoster vaccine live, PF, (ZOSTAVAX) 29562 UNT/0.65ML injection Inject 19,400 Units into the skin once.  1 vial  0  . metFORMIN (GLUCOPHAGE) 500 MG tablet Take 1 tablet (500 mg total) by mouth as directed. Take 1 tablet in the AM and 2 tablets in the PM once a day.  90 tablet  3     Past Medical History  Diagnosis Date  . Hyperlipidemia   . Hypertension   . Diabetes mellitus     diagnosis 2007  . Coronary artery disease     No past surgical history on file.  History   Social History  . Marital Status: Divorced    Spouse Name: N/A    Number of Children: N/A  . Years of Education: N/A   Occupational History  . Not on file.   Social History Main Topics  . Smoking status: Current Some Day Smoker -- 0.2 packs/day    Types: Cigarettes  . Smokeless tobacco: Not on file   Comment: Pt trying to slowly decrease   . Alcohol Use: Yes  . Drug Use: No  .  Sexually Active: Not on file     disabled, single, from [place]   Other Topics Concern  . Not on file   Social History Narrative   Patient gets regular exercise.Caffeine use: occasionally    ROS: pain in left hip from previous injection but no fevers or chills, productive cough, hemoptysis, dysphasia, odynophagia, melena, hematochezia, dysuria, hematuria, rash, seizure activity, orthopnea, PND, pedal edema, claudication. Remaining systems are negative.  Physical Exam: Well-developed well-nourished in no acute distress.  Skin is warm and dry.  HEENT is normal.  Neck is supple. No thyromegaly.  Chest is clear to auscultation with normal expansion.  Cardiovascular exam is regular rate and rhythm. 1/6 systolic ejection murmur Abdominal exam nontender or distended. No masses palpated. Extremities show no edema. neuro grossly intact  ECG NSR, LVH, Nonspecific  T wave changes.

## 2011-10-18 NOTE — Patient Instructions (Signed)
Your physician recommends that you schedule a follow-up appointment in 1 YEAR.  

## 2011-10-18 NOTE — Assessment & Plan Note (Signed)
Patient counseled on discontinuing. 

## 2011-11-12 ENCOUNTER — Telehealth: Payer: Self-pay | Admitting: *Deleted

## 2011-11-12 NOTE — Telephone Encounter (Signed)
Pt left message that he needed a refill on a medication .  Did not leave the name of the medication thst he needed to have refilled.  RTC to pt message left for pt to call the Triage Nurse.  Angelina Ok, RN 11/12/2011 3:45 PM

## 2011-11-13 ENCOUNTER — Other Ambulatory Visit: Payer: Self-pay | Admitting: *Deleted

## 2011-11-14 NOTE — Telephone Encounter (Signed)
Empty electronic request 

## 2011-11-22 ENCOUNTER — Encounter: Payer: Self-pay | Admitting: Internal Medicine

## 2011-11-22 ENCOUNTER — Encounter: Payer: Self-pay | Admitting: Dietician

## 2011-11-22 ENCOUNTER — Ambulatory Visit (INDEPENDENT_AMBULATORY_CARE_PROVIDER_SITE_OTHER): Payer: Medicare HMO | Admitting: Internal Medicine

## 2011-11-22 VITALS — BP 134/97 | HR 115 | Temp 98.0°F | Ht 74.0 in | Wt 183.4 lb

## 2011-11-22 DIAGNOSIS — M545 Low back pain, unspecified: Secondary | ICD-10-CM

## 2011-11-22 DIAGNOSIS — N529 Male erectile dysfunction, unspecified: Secondary | ICD-10-CM

## 2011-11-22 DIAGNOSIS — E119 Type 2 diabetes mellitus without complications: Secondary | ICD-10-CM

## 2011-11-22 DIAGNOSIS — Z23 Encounter for immunization: Secondary | ICD-10-CM

## 2011-11-22 DIAGNOSIS — L720 Epidermal cyst: Secondary | ICD-10-CM

## 2011-11-22 DIAGNOSIS — L723 Sebaceous cyst: Secondary | ICD-10-CM

## 2011-11-22 DIAGNOSIS — R197 Diarrhea, unspecified: Secondary | ICD-10-CM

## 2011-11-22 DIAGNOSIS — I10 Essential (primary) hypertension: Secondary | ICD-10-CM

## 2011-11-22 DIAGNOSIS — E7800 Pure hypercholesterolemia, unspecified: Secondary | ICD-10-CM

## 2011-11-22 DIAGNOSIS — F17200 Nicotine dependence, unspecified, uncomplicated: Secondary | ICD-10-CM

## 2011-11-22 DIAGNOSIS — I2510 Atherosclerotic heart disease of native coronary artery without angina pectoris: Secondary | ICD-10-CM

## 2011-11-22 LAB — POCT GLYCOSYLATED HEMOGLOBIN (HGB A1C): Hemoglobin A1C: 6.6

## 2011-11-22 LAB — POCT URINALYSIS DIPSTICK
Blood, UA: NEGATIVE
Glucose, UA: NEGATIVE
Nitrite, UA: NEGATIVE
Urobilinogen, UA: 0.2
pH, UA: 5

## 2011-11-22 LAB — LIPID PANEL
Cholesterol: 115 mg/dL (ref 0–200)
HDL: 53 mg/dL (ref >39)
Triglycerides: 74 mg/dL (ref <150)

## 2011-11-22 LAB — GLUCOSE, CAPILLARY: Glucose-Capillary: 112 mg/dL — ABNORMAL HIGH (ref 70–99)

## 2011-11-22 MED ORDER — ASPIRIN EC 81 MG PO TBEC: 81.0000 mg | DELAYED_RELEASE_TABLET | Freq: Every day | ORAL | Status: AC

## 2011-11-22 MED ORDER — DICLOFENAC SODIUM 1 % TD GEL: 1.0000 "application " | Freq: Four times a day (QID) | TRANSDERMAL | Status: DC

## 2011-11-22 MED ORDER — METOPROLOL SUCCINATE ER 50 MG PO TB24: 50.0000 mg | ORAL_TABLET | Freq: Every day | ORAL | Status: DC

## 2011-11-22 MED ORDER — SILDENAFIL CITRATE 50 MG PO TABS: 50.0000 mg | ORAL_TABLET | ORAL | Status: DC | PRN

## 2011-11-22 MED ORDER — TRAMADOL HCL 50 MG PO TABS: 50.0000 mg | ORAL_TABLET | Freq: Four times a day (QID) | ORAL | Status: DC | PRN

## 2011-11-22 NOTE — Assessment & Plan Note (Signed)
Blood pressure is well-controlled today of metoprolol. However we will restart his metoprolol. Tachycardia is likely secondary to rebound as he has not taken his metoprolol for a few days

## 2011-11-22 NOTE — Assessment & Plan Note (Signed)
The patient has not had his lipid panel checked in over a year. Was having prior MI and diabetes type 2 his goal LDL is less than 70. We will check LFTs today as he has not eaten since 7 AM. It is essentially fasted for 8 hours. -- Lipid panel -- CMP -- Continue statin unless lipid panel dictates increasing

## 2011-11-22 NOTE — Assessment & Plan Note (Signed)
Ms. Timothy Yu these has very good glucose control. He has not need to check his blood glucose as he has A1c of 6.6 today. He is well-controlled on metformin and diet control only. He is only having mild diarrhea with metformin. -- Will have the patient take his metformin 1000 mg in the morning and 500 mg at night with food. -- Diabetic foot exam performed today. -- CMP performed today -- Flu shot given today -- Pneumovax is up to date --Will refer for diabetic eye exam this will be due in February -- RTC in 3 months

## 2011-11-22 NOTE — Assessment & Plan Note (Addendum)
Overall the patient has done well controlling his risk factors. He is on a beta blocker aspirin and statin. Ideally I would want to start a ARB as he is allergic to lisinopril. We will check him at his next visit to see if his blood pressure will support in addition of losartan. I counseled the patient that 81 mg daily is his affect effective as 325 with fewer side effects. -- Continue metoprolol XL 50 mg daily -- Decrease aspirin to 81 mg daily -- Enroll in silver sneakers program to improve  physical fitness. -- Smoking cessation counseling given

## 2011-11-22 NOTE — Assessment & Plan Note (Signed)
I told the patient that these are benign lesions. He can have it excised if it becomes painful or infected.

## 2011-11-22 NOTE — Progress Notes (Signed)
Subjective:   Patient ID: Timothy Yu male   DOB: 07/20/1951 60 y.o.   MRN: [id number]  HPI: Timothy Yu is a 60 y.o. man with past medical history significant for coronary artery disease, diabetes mellitus type 2 and chronic low back pain.  The patient states that his chronic low back pain had a acute flare this Saturday. The onset was as he was standing from a seated position. The pain is located in his bilateral low back without radiation. Duration has been off and on since Saturday. It is a cramping pain is worse with sitting bending forward, and motion. At these times the pain peaks at 8/10. He currently denies pain. He has tried taking his daughter's Percocet to help himself sleep. Pertinent negatives include new focal weakness, numbness or paresthesia. He states he has a small amount of numbness in the dorsal surface of his left foot. This is following a injection in his back for his back pain. It is unchanged over many months and is not exacerbated during this current back pain flare. He also denies loss of bowel or bladder control, or saddle anesthesia.  With regards to his diabetes, the patient checks his blood glucose twice daily. He did not blink bring his glucometer to the visit today, but states his blood glucoses are very consistently between the low 90s to 120s. It is been very long since he has had a blood glucose greater than 125. He states he takes metformin 1500 mg in the morning and does have some GI distress as a result. He also manages his blood glucose with diet control. He has a notice from [facility] medical asking for specific prescriptions for his diabetes supplies so that these can be supplied to him  The patient also states that he is having erectile dysfunction. He is having difficulty obtaining and maintaining an erection. He would like to engage in sexual activity. He currently does not take his sublingual nitrate. He states he gets a little short of breath when  walking up a cough light of stairs though not with mild activity. He states he is able to perform sex without chest pain or shortness of breath.  With regards to his coronary artery disease, the patient crease continues to smoke occasionally. He has been taking his medicine regularly though is out of his metoprolol.  Timothy Yu also states she hasn't not in his upper mid back. This is been there for approximately 2 months. It does not itch. It is nonpainful.  Past Medical History  Diagnosis Date  . Hyperlipidemia   . Hypertension   . Diabetes mellitus     diagnosis 2007  . Coronary artery disease    Current Outpatient Prescriptions  Medication Sig Dispense Refill  . cilostazol (PLETAL) 50 MG tablet Take 1 tablet (50 mg total) by mouth 2 (two) times daily.  180 tablet  3  . gabapentin (NEURONTIN) 100 MG capsule Take 100 mg by mouth at bedtime.        . hydrochlorothiazide (HYDRODIURIL) 12.5 MG tablet Take 1 tablet (12.5 mg total) by mouth daily.  90 tablet  3  . metFORMIN (GLUCOPHAGE) 500 MG tablet Take 3 tablets (1,500 mg total) by mouth daily with breakfast.  270 tablet  3  . pantoprazole (PROTONIX) 40 MG tablet Take 1 tablet (40 mg total) by mouth daily.  90 tablet  3  . simvastatin (ZOCOR) 40 MG tablet Take 1 tablet (40 mg total) by mouth at bedtime.  90 tablet  3  .  Tamsulosin HCl (FLOMAX) 0.4 MG CAPS Take 1 capsule (0.4 mg total) by mouth daily.  90 capsule  3  . aspirin EC 81 MG tablet Take 1 tablet (81 mg total) by mouth daily.  150 tablet  11  . diclofenac sodium (VOLTAREN) 1 % GEL Apply 1 application topically 4 (four) times daily.  100 g  3  . metFORMIN (GLUCOPHAGE) 500 MG tablet Take 1 tablet (500 mg total) by mouth as directed. Take 1 tablet in the AM and 2 tablets in the PM once a day.  90 tablet  3  . metoprolol (TOPROL-XL) 50 MG 24 hr tablet Take 1 tablet (50 mg total) by mouth daily.  90 tablet  3  . Multiple Vitamin (MULTIVITAMIN) tablet Take 1 tablet by mouth daily.   90 tablet  3  . nitroGLYCERIN (NITROSTAT) 0.4 MG SL tablet Place 0.4 mg under the tongue every 5 (five) minutes as needed. For chest pain.       . sildenafil (VIAGRA) 50 MG tablet Take 1 tablet (50 mg total) by mouth as needed for erectile dysfunction (Take 1 hour before sexual intercourse. Do not take at the same time as your sublingual nitroglycerin).  10 tablet  0  . traMADol (ULTRAM) 50 MG tablet Take 1 tablet (50 mg total) by mouth every 6 (six) hours as needed for pain. Maximum dose= 8 tablets per day  30 tablet  0  . zoster vaccine live, PF, (ZOSTAVAX) 16109 UNT/0.65ML injection Inject 19,400 Units into the skin once.  1 vial  0  . DISCONTD: metoprolol (TOPROL-XL) 50 MG 24 hr tablet Take 1 tablet (50 mg total) by mouth daily.  90 tablet  3   Family History  Problem Relation Age of Onset  . Diabetes Mother   . Heart attack Sister 43  . Ulcers Father   . Colon cancer Neg Hx    History   Social History  . Marital Status: Divorced    Spouse Name: N/A    Number of Children: N/A  . Years of Education: N/A   Social History Main Topics  . Smoking status: Current Some Day Smoker -- 0.2 packs/day    Types: Cigarettes  . Smokeless tobacco: None   Comment: Pt trying to slowly decrease   . Alcohol Use: Yes  . Drug Use: No  . Sexually Active: None     disabled, single, from [place]   Other Topics Concern  . None   Social History Narrative   Patient gets regular exercise.Caffeine use: occasionally   Review of Systems: Constitutional: Denies fever, chills,  Respiratory: Denies SOB. endorses mild DOE. denies cough, chest tightness,  and wheezing.   Cardiovascular: Denies chest pain, palpitations and leg swelling.  Gastrointestinal: Denies nausea, vomiting, abdominal pain. Endorses diarrhea Genitourinary: Denies dysuria, hematuria, flank pain and difficulty urinating.  endorses urgency, frequency, Musculoskeletal: Denies myalgias, joint swelling, arthralgias and gait problem.   endorses back pain, Skin: Endorses a "knot" on his back. Neurological: Denies dizziness, seizures, syncope, weakness, light-headedness, numbness and headaches.    Objective:  Physical Exam: Filed Vitals:   11/22/11 1454  BP: 134/97  Pulse: 115  Temp: 98 F (36.7 C)  TempSrc: Oral  Height: 6\' 2"  (1.88 m)  Weight: 183 lb 6.4 oz (83.19 kg)   Constitutional: Vital signs reviewed.  Patient is a well-developed and well-nourished male in no acute distress and cooperative with exam. Alert and oriented x3.  Head: Normocephalic and atraumatic Mouth: no erythema or exudates, MMM Eyes:  PERRL, EOMI, conjunctivae normal, No scleral icterus.  Neck: No carotid bruit Cardiovascular: Regular tachycardia, S1 normal, S2 normal, no MRG, pulses symmetric and intact bilaterally Pulmonary/Chest: CTAB, no wheezes, rales, or rhonchi Abdominal: Soft. Non-tender, non-distended, bowel sounds are normal, no masses, organomegaly, or guarding present.  GU: no CVA tenderness Musculoskeletal: No joint deformities, erythema, or stiffness, ROM full and no nontender. Straight leg test is negative.  Neurological: A&O x3, Strenght is normal and symmetric bilaterally, cranial nerve II-XII are grossly intact, no focal motor deficit, sensory intact to light touch bilaterally.  Skin: Warm, dry and intact. No rash, cyanosis, or clubbing.  there is a 1 cm subcutaneous nodule with central pore present on the right upper back.  Psychiatric: Normal mood and affect. speech and behavior is normal. Judgment and thought content normal. Cognition and memory are normal.   Assessment & Plan:

## 2011-11-22 NOTE — Assessment & Plan Note (Signed)
Prescription for Viagra given for erectile dysfunction. Patient was extensively counseled to discontinue sexual activity she feels short of breath or has chest pain. He is also not to take a nitrate if he is taking Viagra. He was told that if he ever needs his nitrate, he is not to take Viagra.

## 2011-11-22 NOTE — Patient Instructions (Addendum)
Diabetes: We will call you to set up your ophthalmology appointment. You no longer need to check her blood glucose.  Apply diclofenac gel 4 times times daily to your back. He can also take tramadol up to 4 times daily as needed for back pain. Enroll in the silver sneakers program. This is by far the best thing that you can do to help her back pain.  Do not take Viagra and your sublingual nitroglycerin at the same time, as this could cause a very dangerous drop in blood pressure. I have changed your aspirin to be a smaller dose.  Please take this new dose of 81 mg daily.  Go to your local pharmacy or the health department to get the shingles vaccine. It should only cost around $20.  If any of your lab results are abnormal, we will contact you by phone or send you a letter. If they are normal, we will not contact you, but will be happy to discuss them at your next clinic appointment.  Return to clinic to see Dr. Candy Sledge in 3 months Please bring all your medications to your next clinic appointment.   Lumbosacral Strain Lumbosacral strain is one of the most common causes of back pain. There are many causes of back pain. Most are not serious conditions. CAUSES  Your backbone (spinal column) is made up of 24 main vertebral bodies, the sacrum, and the coccyx. These are held together by muscles and tough, fibrous tissue (ligaments). Nerve roots pass through the openings between the vertebrae. A sudden move or injury to the back may cause injury to, or pressure on, these nerves. This may result in localized back pain or pain movement (radiation) into the buttocks, down the leg, and into the foot. Sharp, shooting pain from the buttock down the back of the leg (sciatica) is frequently associated with a ruptured (herniated) disk. Pain may be caused by muscle spasm alone. Your caregiver can often find the cause of your pain by the details of your symptoms and an exam. In some cases, you may need tests (such as  X-rays). Your caregiver will work with you to decide if any tests are needed based on your specific exam. HOME CARE INSTRUCTIONS   Avoid an underactive lifestyle. Active exercise, as directed by your caregiver, is your greatest weapon against back pain.   Avoid hard physical activities (tennis, racquetball, waterskiing) if you are not in proper physical condition for it. This may aggravate or create problems.   If you have a back problem, avoid sports requiring sudden body movements. Swimming and walking are generally safer activities.   Maintain good posture.   Avoid becoming overweight (obese).   Use bed rest for only the most extreme, sudden (acute) episode. Your caregiver will help you determine how much bed rest is necessary.   For acute conditions, you may put ice on the injured area.   Put ice in a plastic bag.   Place a towel between your skin and the bag.   Leave the ice on for 15 to 20 minutes at a time, every 2 hours, or as needed.   After you are improved and more active, it may help to apply heat for 30 minutes before activities.  See your caregiver if you are having pain that lasts longer than expected. Your caregiver can advise appropriate exercises or therapy if needed. With conditioning, most back problems can be avoided. SEEK IMMEDIATE MEDICAL CARE IF:   You have numbness, tingling, weakness, or problems  with the use of your arms or legs.   You experience severe back pain not relieved with medicines.   There is a change in bowel or bladder control.   You have increasing pain in any area of the body, including your belly (abdomen).   You notice shortness of breath, dizziness, or feel faint.   You feel sick to your stomach (nauseous), are throwing up (vomiting), or become sweaty.   You notice discoloration of your toes or legs, or your feet get very cold.   Your back pain is getting worse.   You have a fever.  MAKE SURE YOU:   Understand these  instructions.   Will watch your condition.   Will get help right away if you are not doing well or get worse.  Document Released: 08/29/2005 Document Revised: 08/01/2011 Document Reviewed: 02/18/2009 [facility] Patient Information 2012 [place], [place].

## 2011-11-22 NOTE — Assessment & Plan Note (Addendum)
Patient counseled extensively that quitting smoking would be very beneficial for his heart, lung and bone health.

## 2011-11-22 NOTE — Assessment & Plan Note (Signed)
Acute on chronic flare of his recurrent back pain. No constitutional or worrisome symptoms. His weight is essentially unchanged over the past couple years. No new focal neurologic weakness or numbness or incontinence, to suggest anything more than lumbosacral strain. I advised him that he could utilize limited rest, pain control with tramadol and Voltaren gel, and return to his daily activities as soon as possible. I informed him that prolonged rest will make his back pain worse. He does not exercise sufficiently to increase back strength. -- Tramadol 50 mg daily as needed for low back pain.  -- Diclofenac gel 4 times a day when necessary for pain -- Enroll in the silver sneakers program to physical fitness and improve musculoskeletal strength.

## 2011-11-22 NOTE — Assessment & Plan Note (Signed)
Diarrhea most likely secondary to one single large dose of metformin during the day. I will have the patient spent out his metformin dose so that he is taking 1000 mg in morning and 500 mg at night.

## 2011-11-23 LAB — COMPLETE METABOLIC PANEL WITH GFR
CO2: 23 mEq/L (ref 19–32)
GFR, Est African American: 58 mL/min — ABNORMAL LOW
GFR, Est Non African American: 50 mL/min — ABNORMAL LOW
Glucose, Bld: 104 mg/dL — ABNORMAL HIGH (ref 70–99)
Sodium: 140 mEq/L (ref 135–145)
Total Bilirubin: 0.4 mg/dL (ref 0.3–1.2)
Total Protein: 7.5 g/dL (ref 6.0–8.3)

## 2011-12-04 DIAGNOSIS — N183 Chronic kidney disease, stage 3 unspecified: Secondary | ICD-10-CM

## 2011-12-04 HISTORY — DX: Chronic kidney disease, stage 3 unspecified: N18.30

## 2012-01-03 ENCOUNTER — Telehealth: Payer: Self-pay | Admitting: *Deleted

## 2012-01-11 NOTE — Telephone Encounter (Signed)
Form from [facility] for Prior Authorization on Lunesta was placed in Dr. Gardiner Sleeper box for completion.  Dr. Truitt Merle of.  Angelina Ok, RN 01/11/2012 9:31 AM

## 2012-01-11 NOTE — Telephone Encounter (Signed)
Im not sure who is prescribing Lunesta. I will evaluate the papers and decide in early March when I get back for night float. I will write him for tramadol now if he wants

## 2012-02-27 NOTE — Progress Notes (Signed)
Addended by: Neomia Dear on: 02/27/2012 05:40 PM   Modules accepted: Orders

## 2012-02-28 ENCOUNTER — Encounter: Payer: Self-pay | Admitting: Internal Medicine

## 2012-02-28 ENCOUNTER — Ambulatory Visit (INDEPENDENT_AMBULATORY_CARE_PROVIDER_SITE_OTHER): Payer: Medicare HMO | Admitting: Internal Medicine

## 2012-02-28 VITALS — BP 116/76 | HR 70 | Temp 97.3°F | Ht 74.0 in | Wt 182.5 lb

## 2012-02-28 DIAGNOSIS — M545 Low back pain: Secondary | ICD-10-CM

## 2012-02-28 DIAGNOSIS — E119 Type 2 diabetes mellitus without complications: Secondary | ICD-10-CM

## 2012-02-28 DIAGNOSIS — I2510 Atherosclerotic heart disease of native coronary artery without angina pectoris: Secondary | ICD-10-CM

## 2012-02-28 DIAGNOSIS — G4700 Insomnia, unspecified: Secondary | ICD-10-CM

## 2012-02-28 DIAGNOSIS — N529 Male erectile dysfunction, unspecified: Secondary | ICD-10-CM

## 2012-02-28 LAB — GLUCOSE, CAPILLARY: Glucose-Capillary: 110 mg/dL — ABNORMAL HIGH (ref 70–99)

## 2012-02-28 LAB — POCT GLYCOSYLATED HEMOGLOBIN (HGB A1C): Hemoglobin A1C: 7

## 2012-02-28 MED ORDER — LOSARTAN POTASSIUM-HCTZ 50-12.5 MG PO TABS: 1.0000 | ORAL_TABLET | Freq: Every day | ORAL | Status: DC

## 2012-02-28 MED ORDER — NITROGLYCERIN 0.4 MG SL SUBL: 0.4000 mg | SUBLINGUAL_TABLET | SUBLINGUAL | Status: DC | PRN

## 2012-02-28 MED ORDER — SILDENAFIL CITRATE 100 MG PO TABS: 100.0000 mg | ORAL_TABLET | Freq: Every day | ORAL | Status: DC | PRN

## 2012-02-28 MED ORDER — METFORMIN HCL 500 MG PO TABS: 1000.0000 mg | ORAL_TABLET | Freq: Two times a day (BID) | ORAL | Status: DC

## 2012-02-28 MED ORDER — TRAZODONE HCL 100 MG PO TABS: 100.0000 mg | ORAL_TABLET | Freq: Every day | ORAL | Status: DC

## 2012-02-28 NOTE — Assessment & Plan Note (Signed)
The patient states his back pain is improving. We will continue his current therapy ablate diclofenac tramadol, and silver sneakers.

## 2012-02-28 NOTE — Assessment & Plan Note (Signed)
Overall the patient's diabetes is well controlled. His hemoglobin A1c today is 7. I will have him continue taking metformin 1000 mg twice daily as he is able to tolerate this without significant diarrhea. Given his creatinine is 1.47 and his GFR is low I will start the patient on losartan (he has ACE allergy). So he will only have to pay 1 co-pay I will combine this medication with his HCTZ. This medication is solely for renal protection as his blood pressure is 116/76 today. If he has symptoms of hypo-tension he is to call our clinic. We will discontinue HCTZ if this is the case.

## 2012-02-28 NOTE — Assessment & Plan Note (Addendum)
The patient requests refill for Viagra. I again counseled him that he should not take this medication with his nitrate as this could cause a potentially fatal drop in blood pressure. I prescribed sildenafil 100 mg to be taken in 50 mg increments prior to intercourse.

## 2012-02-28 NOTE — Patient Instructions (Addendum)
Begin taking trazodone at bedtime for insomnia. If you're still having difficulty sleeping after one month please call and be seen in the clinic. Otherwise we will see you in 3 months for regular diabetes followup. Continue with your excellent and blood glucose management. Begin taking losartan-HCTZ combination pill. Discontinue HCTZ only pill.  Do not take viagra with your nitrate. This can cause a dangerous drop in blood pressure     Insomnia Insomnia is frequent trouble falling and/or staying asleep. Insomnia can be a long term problem or a short term problem. Both are common. Insomnia can be a short term problem when the wakefulness is related to a certain stress or worry. Long term insomnia is often related to ongoing stress during waking hours and/or poor sleeping habits. Overtime, sleep deprivation itself can make the problem worse. Every little thing feels more severe because you are overtired and your ability to cope is decreased. CAUSES   Stress, anxiety, and depression.   Poor sleeping habits.   Distractions such as TV in the bedroom.   Naps close to bedtime.   Engaging in emotionally charged conversations before bed.   Technical reading before sleep.   Alcohol and other sedatives. They may make the problem worse. They can hurt normal sleep patterns and normal dream activity.   Stimulants such as caffeine for several hours prior to bedtime.   Pain syndromes and shortness of breath can cause insomnia.   Exercise late at night.   Changing time zones may cause sleeping problems (jet lag).  It is sometimes helpful to have someone observe your sleeping patterns. They should look for periods of not breathing during the night (sleep apnea). They should also look to see how long those periods last. If you live alone or observers are uncertain, you can also be observed at a sleep clinic where your sleep patterns will be professionally monitored. Sleep apnea requires a checkup and  treatment. Give your caregivers your medical history. Give your caregivers observations your family has made about your sleep.  SYMPTOMS   Not feeling rested in the morning.   Anxiety and restlessness at bedtime.   Difficulty falling and staying asleep.  TREATMENT   Your caregiver may prescribe treatment for an underlying medical disorders. Your caregiver can give advice or help if you are using alcohol or other drugs for self-medication. Treatment of underlying problems will usually eliminate insomnia problems.   Medications can be prescribed for short time use. They are generally not recommended for lengthy use.   Over-the-counter sleep medicines are not recommended for lengthy use. They can be habit forming.   You can promote easier sleeping by making lifestyle changes such as:   Using relaxation techniques that help with breathing and reduce muscle tension.   Exercising earlier in the day.   Changing your diet and the time of your last meal. No night time snacks.   Establish a regular time to go to bed.   Counseling can help with stressful problems and worry.   Soothing music and white noise may be helpful if there are background noises you cannot remove.   Stop tedious detailed work at least one hour before bedtime.  HOME CARE INSTRUCTIONS   Keep a diary. Inform your caregiver about your progress. This includes any medication side effects. See your caregiver regularly. Take note of:   Times when you are asleep.   Times when you are awake during the night.   The quality of your sleep.   How  you feel the next day.  This information will help your caregiver care for you.  Get out of bed if you are still awake after 15 minutes. Read or do some quiet activity. Keep the lights down. Wait until you feel sleepy and go back to bed.   Keep regular sleeping and waking hours. Avoid naps.   Exercise regularly.   Avoid distractions at bedtime. Distractions include watching  television or engaging in any intense or detailed activity like attempting to balance the household checkbook.   Develop a bedtime ritual. Keep a familiar routine of bathing, brushing your teeth, climbing into bed at the same time each night, listening to soothing music. Routines increase the success of falling to sleep faster.   Use relaxation techniques. This can be using breathing and muscle tension release routines. It can also include visualizing peaceful scenes. You can also help control troubling or intruding thoughts by keeping your mind occupied with boring or repetitive thoughts like the old concept of counting sheep. You can make it more creative like imagining planting one beautiful flower after another in your backyard garden.   During your day, work to eliminate stress. When this is not possible use some of the previous suggestions to help reduce the anxiety that accompanies stressful situations.  MAKE SURE YOU:   Understand these instructions.   Will watch your condition.   Will get help right away if you are not doing well or get worse.  Document Released: 11/16/2000 Document Revised: 11/08/2011 Document Reviewed: 12/17/2007 [facility] Patient Information 2012 [place], [place].

## 2012-02-28 NOTE — Progress Notes (Signed)
Subjective:     Patient ID: Timothy Yu, male   DOB: March 05, 1951, 61 y.o.   MRN: [id number]  HPI The patient is a 61 year old man past medical history significant for depression, insomnia, coronary artery disease, diabetes mellitus type 2, hypercholesterolemia, hypertension, GERD, erectile dysfunction and tobacco abuse.  The patient presents today for evaluation of his insomnia. He states the problem began approximately 6 months ago. He can recall no inciting factor. He does state that he is having stress and is feeling down, though this is not more than normal for him. He states he has insomnia every night. He states he goes to bed at approximately 10 PM and cannot fall asleep for up to one hour. He also has early morning awakenings, sometimes awakening at approximately 3 AM. He states he is then able to fall sleep and then will often wake up at 5 AM. The patient describes his symptoms as severe. They have not progressed. He states he has tried taking Lunesta, and this medication did not help him sleep. He also states it was very expensive. Pertinent negatives include cough, GERD, morning headache, snoring, gasping for breath at night, excessive daytime somnolence. He states that he does nap during the day. Associated symptoms include dysphoria and anxiety. He denies anhedonia.  The patient also tells me that he is taking 1000 mg of metformin twice daily instead of the 1000 mg in the morning and 500 mg at night as prescribed.  He also states she needs a refill for sildenafil. He would like me to prescribe the 100 mg tablets as these cause the same is 50 mg tablets. His plan is to break them in half.  Review of Systems  Constitutional: Negative for activity change and appetite change.  Respiratory: Negative for cough and shortness of breath.   Cardiovascular: Negative for chest pain.  Gastrointestinal: Negative for abdominal pain and diarrhea.  Musculoskeletal: Positive for back pain and  arthralgias. Negative for gait problem.  Neurological: Negative for dizziness, weakness and headaches.  Psychiatric/Behavioral: Positive for sleep disturbance and dysphoric mood. Negative for decreased concentration. The patient is nervous/anxious.        Objective:   Physical Exam  Constitutional: He is oriented to person, place, and time. He appears well-developed and well-nourished. No distress.  HENT:  Head: Normocephalic and atraumatic.  Nose: Nose normal.  Mouth/Throat: Uvula is midline, oropharynx is clear and moist and mucous membranes are normal. No oropharyngeal exudate, posterior oropharyngeal edema, posterior oropharyngeal erythema or tonsillar abscesses.  Eyes: Conjunctivae and EOM are normal. Pupils are equal, round, and reactive to light. Right eye exhibits no discharge. Left eye exhibits no discharge. No scleral icterus.  Neck: Normal range of motion. Neck supple. No JVD present. No tracheal deviation present. No thyromegaly present.  Cardiovascular: Normal rate and regular rhythm.  Exam reveals no gallop and no friction rub.   No murmur heard. Pulmonary/Chest: Effort normal and breath sounds normal. No stridor. No respiratory distress. He has no wheezes. He has no rales.  Musculoskeletal: Normal range of motion. He exhibits no edema and no tenderness.  Neurological: He is alert and oriented to person, place, and time. No cranial nerve deficit. Coordination normal.  Skin: Skin is warm and dry. No rash noted. He is not diaphoretic. No erythema.  Psychiatric: His speech is normal and behavior is normal. Judgment and thought content normal. His mood appears not anxious. His affect is not blunt. Cognition and memory are normal. He exhibits a depressed mood.  Assessment:     Please see problem-based charting

## 2012-02-28 NOTE — Assessment & Plan Note (Addendum)
The patient is suffering from insomnia that is affecting his daily life. This is likely secondary to poor sleep hygiene with the potential component of depression. I advised him to improve his sleep hygiene and gave him a handout regarding previous sleep hygiene. In summary I recommended that he not watch TV at bedtime. That he not drink alcohol immediately prior to bed, and that he not nap during the daytime. I also recommended that he attempt to fall asleep at the same time every night. I recommended that he didn't lites 30 minutes prior to bedtime and to engage in non-stimulating activity such as reading during this time. I suggested that he use the bedroom only for sleep and sex. He is unable to follow sleep after 30 minutes used to get up and read or perform another such nonsedating activity and an attempt to fall asleep after he feels tired. I will also give him a prescription for trazodone today. This medication should have a lower co-pay than Lunesta. It also may have some antidepressive effect.  If he is not improved in one month I asked him to return to clinic and we will consider starting him on a antidepressant medication.

## 2012-05-19 ENCOUNTER — Other Ambulatory Visit: Payer: Self-pay | Admitting: Internal Medicine

## 2012-05-22 ENCOUNTER — Encounter: Payer: Self-pay | Admitting: Internal Medicine

## 2012-05-22 ENCOUNTER — Ambulatory Visit (INDEPENDENT_AMBULATORY_CARE_PROVIDER_SITE_OTHER): Payer: Medicare HMO | Admitting: Internal Medicine

## 2012-05-22 VITALS — BP 117/79 | HR 75 | Temp 97.7°F | Ht 74.0 in | Wt 186.7 lb

## 2012-05-22 DIAGNOSIS — R1013 Epigastric pain: Secondary | ICD-10-CM

## 2012-05-22 DIAGNOSIS — G4700 Insomnia, unspecified: Secondary | ICD-10-CM

## 2012-05-22 DIAGNOSIS — E119 Type 2 diabetes mellitus without complications: Secondary | ICD-10-CM

## 2012-05-22 DIAGNOSIS — R197 Diarrhea, unspecified: Secondary | ICD-10-CM

## 2012-05-22 DIAGNOSIS — Z79899 Other long term (current) drug therapy: Secondary | ICD-10-CM

## 2012-05-22 DIAGNOSIS — R209 Unspecified disturbances of skin sensation: Secondary | ICD-10-CM

## 2012-05-22 DIAGNOSIS — R200 Anesthesia of skin: Secondary | ICD-10-CM

## 2012-05-22 LAB — GLUCOSE, CAPILLARY: Glucose-Capillary: 99 mg/dL (ref 70–99)

## 2012-05-22 MED ORDER — GLIPIZIDE 5 MG PO TABS: 5.0000 mg | ORAL_TABLET | Freq: Two times a day (BID) | ORAL | Status: DC

## 2012-05-22 MED ORDER — AMITRIPTYLINE HCL 50 MG PO TABS: 100.0000 mg | ORAL_TABLET | Freq: Every day | ORAL | Status: DC

## 2012-05-22 NOTE — Patient Instructions (Addendum)
Stop taking metformin.   Start taking glipizide 5 mg daily for your diabetes  Start taking amitriptyline for your leg pain. You need to taper this medication up as follows: Week 1: Take one half tablet at bedtime Week 2:take 1 tablet at bedtime  Week 3: Take 1-1/2 tablet at bedtime Week 4 and thereafter: Take 2 tablets at bedtime  If any of your lab results are abnormal we will contact you by phone or send you a letter. If they are normal, we will not contact you, but will be happy to discuss them at your next clinic appointment.  We have referred you to the gastroenterologist for a colonoscopy. It is very important you attend this visit.  Return to clinic to see your new PCP in 1 month.  Please bring all your medications to your next clinic appointment.   Diabetes and Exercise Regular exercise is important and can help:   Control blood glucose (sugar).   Decrease blood pressure.    Control blood lipids (cholesterol, triglycerides).   Improve overall health.  BENEFITS FROM EXERCISE  Improved fitness.   Improved flexibility.   Improved endurance.   Increased bone density.   Weight control.   Increased muscle strength.   Decreased body fat.   Improvement of the body's use of insulin, a hormone.   Increased insulin sensitivity.   Reduction of insulin needs.   Reduced stress and tension.   Helps you feel better.  People with diabetes who add exercise to their lifestyle gain additional benefits, including:  Weight loss.   Reduced appetite.   Improvement of the body's use of blood glucose.   Decreased risk factors for heart disease:   Lowering of cholesterol and triglycerides.   Raising the level of good cholesterol (high-density lipoproteins, HDL).   Lowering blood sugar.   Decreased blood pressure.  TYPE 1 DIABETES AND EXERCISE  Exercise will usually lower your blood glucose.   If blood glucose is greater than 240 mg/dl, check urine ketones. If  ketones are present, do not exercise.   Location of the insulin injection sites may need to be adjusted with exercise. Avoid injecting insulin into areas of the body that will be exercised. For example, avoid injecting insulin into:   The arms when playing tennis.   The legs when jogging. For more information, discuss this with your caregiver.   Keep a record of:   Food intake.   Type and amount of exercise.   Expected peak times of insulin action.   Blood glucose levels.  Do this before, during, and after exercise. Review your records with your caregiver. This will help you to develop guidelines for adjusting food intake and insulin amounts.  TYPE 2 DIABETES AND EXERCISE  Regular physical activity can help control blood glucose.   Exercise is important because it may:   Increase the body's sensitivity to insulin.   Improve blood glucose control.   Exercise reduces the risk of heart disease. It decreases serum cholesterol and triglycerides. It also lowers blood pressure.   Those who take insulin or oral hypoglycemic agents should watch for signs of hypoglycemia. These signs include dizziness, shaking, sweating, chills, and confusion.   Body water is lost during exercise. It must be replaced. This will help to avoid loss of body fluids (dehydration) or heat stroke.  Be sure to talk to your caregiver before starting an exercise program to make sure it is safe for you. Remember, any activity is better than none.  Document  Released: 02/09/2004 Document Revised: 11/08/2011 Document Reviewed: 05/26/2009 [facility] Patient Information 2012 [place], [place].

## 2012-05-22 NOTE — Progress Notes (Signed)
Subjective:     Patient ID: Timothy Yu, male   DOB: [birth year], 61 y.o.   MRN: [id number] Mr.Berkley Cronkright is a 61 y.o. man with past medical history significant for coronary artery disease, diabetes mellitus type 2 and chronic low back and left foot pain. He presents for diarrhea and left foot pain.   Abdominal Pain This is a new (However the patient has had diarrhea and constipation associated with metformin in the past) problem. Episode onset: about 1 month ago. The problem occurs daily. The problem has been unchanged. Pain location: shifts to his dependent side when laying down. No pain when sitting or standing. The pain is mild. The quality of the pain is cramping. Associated symptoms include arthralgias and diarrhea. Pertinent negatives include no anorexia, constipation, dysuria, fever, frequency, headaches, hematochezia, melena, myalgias, nausea or vomiting. Associated symptoms comments: Has morning diarrhea and evening diarrhea after metformin doses. Exacerbated by: laying down.  He is currently taking metformin 500 mg every morning and 1000 mg in the evening. The pain is relieved by nothing. He has tried proton pump inhibitors for the symptoms. The treatment provided no relief. His past medical history is significant for GERD. There is no history of abdominal surgery.  Foot Injury  Incident onset: 1 year ago. Incident location: in the ed. Injury mechanism: recieved an injection in his spine for back pain. The pain is present in the left leg and left foot (states pain immediately ran down his leg and into the dorsum of his left foot and great toe. Denies lateral foot or posterior calf involvement). Quality: pinching and numbish. The pain is at a severity of 2/10. The pain is mild. The pain has been fluctuating since onset. Associated symptoms include tingling. Pertinent negatives include no inability to bear weight, loss of motion, loss of sensation, muscle weakness or numbness. Associated  symptoms comments: numbish feeling, but no LOS. Treatments tried: tramadol. Also takes very low dose gabapentin each bedtime. States when he lays down go to sleep his pain does feel better. The treatment provided mild relief.   Depression: States is feeling better and denies depressed mood today.  Insomnia: States is still having difficulty falling asleep. Trazodone helps him sleep to good effect, but makes him feel strange. Does not regularly take trazodone for this reason.  Review of Systems  Constitutional: Negative for fever.  Respiratory: Negative for shortness of breath.   Cardiovascular: Negative for chest pain.  Gastrointestinal: Positive for abdominal pain, diarrhea and blood in stool. Negative for nausea, vomiting, constipation, melena, hematochezia and anorexia.  Genitourinary: Negative for dysuria and frequency.  Musculoskeletal: Positive for arthralgias. Negative for myalgias.  Neurological: Positive for tingling. Negative for dizziness, weakness, numbness and headaches.  Psychiatric/Behavioral: Positive for disturbed wake/sleep cycle. Negative for dysphoric mood.   Objective: Filed Vitals:   05/22/12 1429  BP: 117/79  Pulse: 75  Temp: 97.7 F (36.5 C)   Filed Weights   05/22/12 1429  Weight: 186 lb 11.2 oz (84.687 kg)  Body mass index is 23.97 kg/(m^2).     Physical Exam  Constitutional: He appears well-developed and well-nourished. No distress.  Cardiovascular: Normal rate, regular rhythm, normal heart sounds and intact distal pulses.   Pulmonary/Chest: Effort normal and breath sounds normal. No respiratory distress.  Abdominal: Soft. Normal appearance and bowel sounds are normal. He exhibits no distension and no mass. There is no tenderness. There is no rigidity, no rebound, no guarding, no CVA tenderness, no tenderness at McBurney's point and negative Murphy's  sign.  Musculoskeletal: Normal range of motion. He exhibits no edema and no tenderness.       Right  foot: Normal. He exhibits normal range of motion, no tenderness, no bony tenderness, no swelling, no crepitus and no deformity.       Left foot: Normal. He exhibits normal range of motion, no tenderness, no bony tenderness, no swelling, no crepitus and no deformity.  Neurological: He is alert. He exhibits normal muscle tone.  Skin: Skin is warm and dry. He is not diaphoretic.   Results for orders placed in visit on 05/22/12 (from the past 72 hour(s))  GLUCOSE, CAPILLARY     Status: Normal   Collection Time   05/22/12  2:44 PM      Component Value Range Comment   Glucose-Capillary 99  70 - 99 mg/dL   POCT GLYCOSYLATED HEMOGLOBIN (HGB A1C)     Status: Normal   Collection Time   05/22/12  3:04 PM      Component Value Range Comment   Hemoglobin A1C 7.3     BASIC METABOLIC PANEL WITH GFR     Status: Abnormal   Collection Time   05/22/12  4:29 PM      Component Value Range Comment   Sodium 141  135 - 145 mEq/L    Potassium 4.2  3.5 - 5.3 mEq/L    Chloride 103  96 - 112 mEq/L    CO2 27  19 - 32 mEq/L    Glucose, Bld 88  70 - 99 mg/dL    BUN 13  6 - 23 mg/dL    Creat 5.62 (*) 1.30 - 1.35 mg/dL    Calcium 86.5  8.4 - 10.5 mg/dL    GFR, Est African American 55 (*)     GFR, Est Non African American 47 (*)         Assessment:   Case discussed with Dr. Rogelia Boga. Please see problem oriented charting for assessment and plan by problem (best viewed under encounters tab).

## 2012-05-23 ENCOUNTER — Telehealth: Payer: Self-pay | Admitting: *Deleted

## 2012-05-23 LAB — BASIC METABOLIC PANEL WITH GFR
CO2: 27 mEq/L (ref 19–32)
Calcium: 10.1 mg/dL (ref 8.4–10.5)
Chloride: 103 mEq/L (ref 96–112)
GFR, Est African American: 55 mL/min — ABNORMAL LOW
GFR, Est Non African American: 47 mL/min — ABNORMAL LOW
Glucose, Bld: 88 mg/dL (ref 70–99)
Potassium: 4.2 mEq/L (ref 3.5–5.3)
Sodium: 141 mEq/L (ref 135–145)

## 2012-05-23 MED ORDER — GLIPIZIDE 5 MG PO TABS: 5.0000 mg | ORAL_TABLET | Freq: Every day | ORAL | Status: DC

## 2012-05-23 NOTE — Assessment & Plan Note (Signed)
Assessment: Diarrhea most likely secondary to metformin.  Plan: We will discontinue this medication today.

## 2012-05-23 NOTE — Assessment & Plan Note (Addendum)
Assessment: Feelings of numbness and pain in the left leg and foot . This does correspond with an L5 distribution.  Currently taking gabapentin at night and this corresponds with his periods of least pain. However, he states he requires better control. Also has comorbid insomnia and depression  Plan: Will start amitriptyline for neuropathic pain, insomnia, and depression. We will start amlodipine and 25 mg by mouth each bedtime x1 week. He will increase the dose by 25 mg each week until he reaches a target dose of amitriptyline 100 mg by mouth each bedtime. We will have him followup in one month with his new PCP

## 2012-05-23 NOTE — Assessment & Plan Note (Addendum)
Assessment: Diabetes mellitus type 2 with good control on oral medication. Now having side effects from metformin use.  Plan: Discontinue metformin. We'll start glipizide 5 mg daily. Patient will followup with me or PCP in one month

## 2012-05-23 NOTE — Assessment & Plan Note (Signed)
Assessment: Patient still suffering from insomnia. Trazodone helps the patient's sleep and makes him feel odd.   Plan: I will start amitriptyline as previously stated. I asked Timothy Yu to temporarily discontinue trazodone while we are starting amitriptyline so that he does not get too drowsy. Once he is familiar with the effects of amitriptyline, I suggested taking one half dose of trazodone to start out, he still requires help with sleep.

## 2012-05-23 NOTE — Assessment & Plan Note (Addendum)
Assessment: Epigastric pain that is episodic likely secondary to metformin use.  Plan: Discontinue metformin. Patient will followup in one month. He will call the clinic if he is having any problems or worsening of symptoms. We also placed a referral today for screening colonoscopy.

## 2012-05-23 NOTE — Telephone Encounter (Signed)
[facility] called to see if they could get an order for 60 glipizide since pt takes twice a day. Talked with Dr Candy Sledge - no problems - will change in Epic. Verbal oder given to pharmacy for #60 glipizide. Stanton Kidney Jesua Tamblyn RN 05/23/12 10:30AM

## 2012-05-27 ENCOUNTER — Telehealth: Payer: Self-pay | Admitting: *Deleted

## 2012-05-27 NOTE — Telephone Encounter (Signed)
Call to patient to inform him of his appointment with [facility] for a Colonoscopy.  Message left for pt to call the Clinics.  Angelina Ok, RN 05/27/2012 9:25 AM. RTC from pt given appointment with [facility]- Dr. Evette Cristal for Colonoscopy prep on 06/18/2012 at 11:00 AM to arrive by 10:45 AM.   Pt was informed to take his insurance cards, medication or a list of and an ID.  Pt was also informed that the Colonoscopy would be scheduled at another time at this appointment.   Pt voiced understanding of plan.  Angelina Ok, RN  05/27/2012 11:33 AM

## 2012-06-11 ENCOUNTER — Telehealth: Payer: Self-pay | Admitting: *Deleted

## 2012-06-11 NOTE — Telephone Encounter (Signed)
Call from pt given appointment with [facility] for Colonoscopy. Appointment is scheduled for 06/18/2012 with Dr. Evette Cristal.  Angelina Ok, RN 06/11/2012 11:00 AM

## 2012-08-15 ENCOUNTER — Other Ambulatory Visit: Payer: Self-pay | Admitting: *Deleted

## 2012-08-15 DIAGNOSIS — E119 Type 2 diabetes mellitus without complications: Secondary | ICD-10-CM

## 2012-08-15 MED ORDER — GLIPIZIDE 5 MG PO TABS: 5.0000 mg | ORAL_TABLET | Freq: Every day | ORAL | Status: DC

## 2012-10-24 ENCOUNTER — Encounter: Payer: Self-pay | Admitting: Internal Medicine

## 2012-11-04 ENCOUNTER — Encounter: Payer: Medicare HMO | Admitting: Internal Medicine

## 2012-11-17 ENCOUNTER — Encounter: Payer: Self-pay | Admitting: Internal Medicine

## 2012-11-17 DIAGNOSIS — G5793 Unspecified mononeuropathy of bilateral lower limbs: Secondary | ICD-10-CM | POA: Insufficient documentation

## 2012-11-17 DIAGNOSIS — N183 Chronic kidney disease, stage 3 (moderate): Secondary | ICD-10-CM

## 2012-11-18 ENCOUNTER — Ambulatory Visit (INDEPENDENT_AMBULATORY_CARE_PROVIDER_SITE_OTHER): Payer: Medicare HMO | Admitting: Internal Medicine

## 2012-11-18 ENCOUNTER — Encounter: Payer: Self-pay | Admitting: Internal Medicine

## 2012-11-18 VITALS — BP 133/81 | HR 83 | Ht 74.0 in | Wt 188.8 lb

## 2012-11-18 DIAGNOSIS — E119 Type 2 diabetes mellitus without complications: Secondary | ICD-10-CM

## 2012-11-18 DIAGNOSIS — R200 Anesthesia of skin: Secondary | ICD-10-CM

## 2012-11-18 DIAGNOSIS — I10 Essential (primary) hypertension: Secondary | ICD-10-CM

## 2012-11-18 DIAGNOSIS — G4700 Insomnia, unspecified: Secondary | ICD-10-CM

## 2012-11-18 DIAGNOSIS — Z79899 Other long term (current) drug therapy: Secondary | ICD-10-CM

## 2012-11-18 DIAGNOSIS — E1149 Type 2 diabetes mellitus with other diabetic neurological complication: Secondary | ICD-10-CM

## 2012-11-18 DIAGNOSIS — Z0000 Encounter for general adult medical examination without abnormal findings: Secondary | ICD-10-CM

## 2012-11-18 DIAGNOSIS — F17200 Nicotine dependence, unspecified, uncomplicated: Secondary | ICD-10-CM

## 2012-11-18 DIAGNOSIS — N400 Enlarged prostate without lower urinary tract symptoms: Secondary | ICD-10-CM

## 2012-11-18 DIAGNOSIS — K219 Gastro-esophageal reflux disease without esophagitis: Secondary | ICD-10-CM

## 2012-11-18 DIAGNOSIS — G5793 Unspecified mononeuropathy of bilateral lower limbs: Secondary | ICD-10-CM

## 2012-11-18 DIAGNOSIS — I2510 Atherosclerotic heart disease of native coronary artery without angina pectoris: Secondary | ICD-10-CM

## 2012-11-18 DIAGNOSIS — I129 Hypertensive chronic kidney disease with stage 1 through stage 4 chronic kidney disease, or unspecified chronic kidney disease: Secondary | ICD-10-CM

## 2012-11-18 DIAGNOSIS — E785 Hyperlipidemia, unspecified: Secondary | ICD-10-CM

## 2012-11-18 DIAGNOSIS — E1142 Type 2 diabetes mellitus with diabetic polyneuropathy: Secondary | ICD-10-CM

## 2012-11-18 LAB — LIPID PANEL
HDL: 46 mg/dL (ref >39)
LDL Cholesterol: 45 mg/dL (ref 0–99)
Total CHOL/HDL Ratio: 2.8 Ratio
Triglycerides: 183 mg/dL — ABNORMAL HIGH (ref <150)
VLDL: 37 mg/dL (ref 0–40)

## 2012-11-18 LAB — BASIC METABOLIC PANEL WITH GFR
Calcium: 9.5 mg/dL (ref 8.4–10.5)
Creat: 1.58 mg/dL — ABNORMAL HIGH (ref 0.50–1.35)
GFR, Est African American: 53 mL/min — ABNORMAL LOW
GFR, Est Non African American: 46 mL/min — ABNORMAL LOW
Glucose, Bld: 206 mg/dL — ABNORMAL HIGH (ref 70–99)
Sodium: 137 mEq/L (ref 135–145)

## 2012-11-18 MED ORDER — AMITRIPTYLINE HCL 50 MG PO TABS: 100.0000 mg | ORAL_TABLET | Freq: Every day | ORAL | Status: DC

## 2012-11-18 MED ORDER — GLIPIZIDE 5 MG PO TABS: 5.0000 mg | ORAL_TABLET | Freq: Every day | ORAL | Status: DC

## 2012-11-18 NOTE — Assessment & Plan Note (Signed)
  Assessment:  Progress toward smoking cessation:  smoking the same amount  Barriers to progress toward smoking cessation:  none  Comments: Trying to cut down  Plan:  Instruction/counseling given:  I advised patient to stop smoking.  Educational resources provided:  [employer]) brochure  Self management tools provided:     Medications to assist with smoking cessation:  none Patient agreed to the following self-care plans for smoking cessation:  call [facility] (1-800-QUIT-NOW)   Other: Gave handout. F/U next visit

## 2012-11-18 NOTE — Assessment & Plan Note (Signed)
Educated pt on Dx and need to avoid freq NSAID's. Check BMP and micro. May need ARB.

## 2012-11-18 NOTE — Assessment & Plan Note (Signed)
On elavil 50 and helps him sleep until 1 AM and then up. Will increase to 100 mg and see response next visit. Need to educate on sleep hygiene next visit.

## 2012-11-18 NOTE — Patient Instructions (Signed)
General Instructions: Please do NOT take Goody's, Ibuprofen, Aleve, Naproxen on a regular basis as they can hurt your kidneys Try tylenol for aches and pains You are doing great with your diabetes and blood pressure Increase Elavil to 100 mg at night See me in 3 months    Treatment Goals:  Goals (1 Years of Data) as of 11/18/2012          As of Today 05/22/12 02/28/12 11/22/11 10/18/11     Blood Pressure    . Blood Pressure < 140/90  133/81 117/79 116/76 134/97 130/82     Result Component    . HEMOGLOBIN A1C < 7.0  7.0 7.3 7.0 6.6     . LDL CALC < 100     47       Progress Toward Treatment Goals:  Treatment Goal 11/18/2012  Hemoglobin A1C at goal  Blood pressure at goal  Stop smoking smoking the same amount    Self Care Goals & Plans:  Self Care Goal 11/18/2012  Manage my medications take my medicines as prescribed; bring my medications to every visit; refill my medications on time  Monitor my health (No Data)  Eat healthy foods eat more vegetables; eat foods that are low in salt; eat fruit for snacks and desserts  Be physically active take a walk every day  Stop smoking call [facility] (1-800-QUIT-NOW)    Home Blood Glucose Monitoring 11/18/2012  Check my blood sugar (No Data)     Care Management & Community Referrals:  Referral 11/18/2012  Referrals made for care management support none needed  Referrals made to community resources other (see comments)
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
## 2012-11-18 NOTE — Assessment & Plan Note (Signed)
Check lipids today. Not fasting. Cont statin.

## 2012-11-18 NOTE — Assessment & Plan Note (Signed)
Has numbness L great toe that extends up to calf. Sometimes painful and makes toe cold. I cannot get any real sxs of claudication so doubt that. Since so focal doubt diabetic neuropathy.  Will increase Elavil to 100 QHS

## 2012-11-18 NOTE — Assessment & Plan Note (Signed)
Pt no longer on Gabapentin as caused mouth numbness and made him feel bad. Will increase elavil to 100 QHS and assess response next visit.
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
## 2012-11-18 NOTE — Assessment & Plan Note (Signed)
On daily PPI. Occassionally gets sxs and uses home remedies like baking soda.

## 2012-11-18 NOTE — Assessment & Plan Note (Signed)
Lab Results  Component Value Date   HGBA1C 7.0 11/18/2012   HGBA1C 7.3 05/22/2012   HGBA1C 7.0 02/28/2012     Assessment:  Diabetes control: good control (HgbA1C at goal)  Progress toward A1C goal:  at goal  Comments: Has meter but was told he didn't need to check CBG  Plan:  Medications:  continue current medications  Home glucose monitoring:   Frequency:  (No CBG needed)   Timing:    Instruction/counseling given: reminded to get eye exam  Educational resources provided: other (see comments) (None needed)  Self management tools provided: other (see comments) (Does not need home CBG)  Other plans: Pt will check with optho. Tries to eat healthy but had fast food breakfast sandwich this AM.

## 2012-11-18 NOTE — Assessment & Plan Note (Signed)
Plans to stop by cards later today to see about F/U appt. No CP but does get DOE after one flight of stairs. On ASA, BB, statin.  Concern over why pt on Pletal. Will need to investigate and see if has had ABI's.

## 2012-11-18 NOTE — Assessment & Plan Note (Signed)
BP Readings from Last 3 Encounters:  11/18/12 133/81  05/22/12 117/79  02/28/12 116/76    Lab Results  Component Value Date   NA 141 05/22/2012   K 4.2 05/22/2012   CREATININE 1.55* 05/22/2012    Assessment:  Blood pressure control: controlled  Progress toward BP goal:  at goal  Comments:   Plan:  Medications:  continue current medications  Educational resources provided:    Self management tools provided: other (see comments) (None needed)  Other plans: Had ARB on med list but pt not taking. Didn't recognize name. BP OK but will check micro alb as might be good to have ARB on 2/2 CKD. Has refills on all meds until June

## 2012-11-18 NOTE — Assessment & Plan Note (Signed)
Denies ever having sxs and was put on to help prostate.

## 2012-11-19 ENCOUNTER — Encounter: Payer: Self-pay | Admitting: Internal Medicine

## 2012-11-19 LAB — MICROALBUMIN / CREATININE URINE RATIO
Creatinine, Urine: 121.5 mg/dL
Microalb Creat Ratio: 4.1 mg/g (ref 0.0–30.0)

## 2013-01-09 ENCOUNTER — Telehealth: Payer: Self-pay | Admitting: Cardiology

## 2013-01-09 NOTE — Telephone Encounter (Signed)
New Problem: ° ° ° °I called the patient and was unable to reach them. I left a message on their voicemail with my name, the reason I called, the name of their physician, and a number to call back to schedule their appointment. ° °

## 2013-01-13 ENCOUNTER — Ambulatory Visit (INDEPENDENT_AMBULATORY_CARE_PROVIDER_SITE_OTHER): Payer: Medicare HMO | Admitting: Internal Medicine

## 2013-01-13 ENCOUNTER — Encounter: Payer: Self-pay | Admitting: Internal Medicine

## 2013-01-13 VITALS — BP 142/90 | HR 80 | Temp 97.0°F | Ht 74.0 in | Wt 198.1 lb

## 2013-01-13 DIAGNOSIS — Z23 Encounter for immunization: Secondary | ICD-10-CM

## 2013-01-13 DIAGNOSIS — E1149 Type 2 diabetes mellitus with other diabetic neurological complication: Secondary | ICD-10-CM

## 2013-01-13 DIAGNOSIS — E119 Type 2 diabetes mellitus without complications: Secondary | ICD-10-CM

## 2013-01-13 DIAGNOSIS — R200 Anesthesia of skin: Secondary | ICD-10-CM

## 2013-01-13 DIAGNOSIS — K219 Gastro-esophageal reflux disease without esophagitis: Secondary | ICD-10-CM

## 2013-01-13 DIAGNOSIS — I2510 Atherosclerotic heart disease of native coronary artery without angina pectoris: Secondary | ICD-10-CM

## 2013-01-13 DIAGNOSIS — F17200 Nicotine dependence, unspecified, uncomplicated: Secondary | ICD-10-CM

## 2013-01-13 DIAGNOSIS — I129 Hypertensive chronic kidney disease with stage 1 through stage 4 chronic kidney disease, or unspecified chronic kidney disease: Secondary | ICD-10-CM

## 2013-01-13 DIAGNOSIS — E785 Hyperlipidemia, unspecified: Secondary | ICD-10-CM

## 2013-01-13 DIAGNOSIS — Z0000 Encounter for general adult medical examination without abnormal findings: Secondary | ICD-10-CM

## 2013-01-13 DIAGNOSIS — N400 Enlarged prostate without lower urinary tract symptoms: Secondary | ICD-10-CM

## 2013-01-13 DIAGNOSIS — G5793 Unspecified mononeuropathy of bilateral lower limbs: Secondary | ICD-10-CM

## 2013-01-13 DIAGNOSIS — M545 Low back pain: Secondary | ICD-10-CM

## 2013-01-13 DIAGNOSIS — G4700 Insomnia, unspecified: Secondary | ICD-10-CM

## 2013-01-13 DIAGNOSIS — I10 Essential (primary) hypertension: Secondary | ICD-10-CM

## 2013-01-13 DIAGNOSIS — E1142 Type 2 diabetes mellitus with diabetic polyneuropathy: Secondary | ICD-10-CM

## 2013-01-13 LAB — GLUCOSE, CAPILLARY: Glucose-Capillary: 105 mg/dL — ABNORMAL HIGH (ref 70–99)

## 2013-01-13 MED ORDER — CILOSTAZOL 50 MG PO TABS: ORAL_TABLET | ORAL | Status: DC

## 2013-01-13 MED ORDER — METOPROLOL SUCCINATE ER 50 MG PO TB24: ORAL_TABLET | ORAL | Status: DC

## 2013-01-13 MED ORDER — GLIPIZIDE 5 MG PO TABS: 5.0000 mg | ORAL_TABLET | Freq: Every day | ORAL | Status: DC

## 2013-01-13 MED ORDER — SIMVASTATIN 40 MG PO TABS: ORAL_TABLET | ORAL | Status: DC

## 2013-01-13 MED ORDER — NITROGLYCERIN 0.4 MG SL SUBL: 0.4000 mg | SUBLINGUAL_TABLET | SUBLINGUAL | Status: DC | PRN

## 2013-01-13 MED ORDER — TAMSULOSIN HCL 0.4 MG PO CAPS: ORAL_CAPSULE | ORAL | Status: DC

## 2013-01-13 MED ORDER — HYDROCHLOROTHIAZIDE 12.5 MG PO CAPS: ORAL_CAPSULE | ORAL | Status: DC

## 2013-01-13 MED ORDER — AMITRIPTYLINE HCL 75 MG PO TABS: 75.0000 mg | ORAL_TABLET | Freq: Every day | ORAL | Status: DC

## 2013-01-13 MED ORDER — DICLOFENAC SODIUM 1 % TD GEL: 1.0000 "application " | Freq: Four times a day (QID) | TRANSDERMAL | Status: DC

## 2013-01-13 MED ORDER — PANTOPRAZOLE SODIUM 40 MG PO TBEC: DELAYED_RELEASE_TABLET | ORAL | Status: DC

## 2013-01-13 NOTE — Assessment & Plan Note (Signed)
Lab Results  Component Value Date   HGBA1C 7.0 11/18/2012   HGBA1C 7.3 05/22/2012   HGBA1C 7.0 02/28/2012     Assessment:  Diabetes control: good control (HgbA1C at goal)  Progress toward A1C goal:  at goal  Comments: No CBG needed. Last A1C 7 12/13. Only on glipizide. Foot exam today. UTD on all other items.  Plan:  Medications:  continue current medications  Home glucose monitoring:   Frequency:  (None needed)   Timing:    Instruction/counseling given: discussed foot care  Educational resources provided:    Self management tools provided:    Other plans: F/U April.

## 2013-01-13 NOTE — Progress Notes (Signed)
  Subjective:    Patient ID: Timothy Yu, male    DOB: 01-27-1951, 62 y.o.   MRN: [id number]  HPI  Please see the A&P for the status of the pt's chronic medical problems.   Review of Systems     Objective:   Physical Exam  Constitutional: He is oriented to person, place, and time. He appears well-developed and well-nourished. No distress.  HENT:  Head: Normocephalic and atraumatic.  Right Ear: External ear normal.  Left Ear: External ear normal.  Nose: Nose normal.  Mouth/Throat: Oropharynx is clear and moist.  Eyes: Conjunctivae and EOM are normal. Right eye exhibits no discharge. Left eye exhibits no discharge.  Neck: Neck supple.  Cardiovascular: Normal rate, regular rhythm and normal heart sounds.   No carotid bruits  Musculoskeletal: Normal range of motion. He exhibits no edema.  Lymphadenopathy:    He has no cervical adenopathy.  Neurological: He is alert and oriented to person, place, and time.  Skin: Skin is warm and dry. He is not diaphoretic.  Psychiatric: He has a normal mood and affect. His behavior is normal. Judgment and thought content normal.          Assessment & Plan:

## 2013-01-13 NOTE — Patient Instructions (Signed)
General Instructions: See me in 3 months I sent in 3 month refills on all meds. Call me if there is a problem   Treatment Goals:  Goals (1 Years of Data) as of 01/13/13         As of Today 11/18/12 05/22/12 02/28/12 11/22/11     Blood Pressure    . Blood Pressure < 140/90  142/90 133/81 117/79 116/76 134/97     Result Component    . HEMOGLOBIN A1C < 7.0   7.0 7.3 7.0 6.6    . LDL CALC < 100   45   47      Progress Toward Treatment Goals:  Treatment Goal 01/13/2013  Hemoglobin A1C at goal  Blood pressure unchanged  Stop smoking smoking less    Self Care Goals & Plans:  Self Care Goal 01/13/2013  Manage my medications take my medicines as prescribed; refill my medications on time  Monitor my health keep track of my blood glucose; check my feet daily  Eat healthy foods drink diet soda or water instead of juice or soda; eat foods that are low in salt  Be physically active take a walk every day  Stop smoking set a quit date and stop smoking    Home Blood Glucose Monitoring 01/13/2013  Check my blood sugar (No Data)     Care Management & Community Referrals:  Referral 01/13/2013  Referrals made for care management support none needed  Referrals made to community resources other (see comments)

## 2013-01-13 NOTE — Assessment & Plan Note (Addendum)
The elavil didn't help so will increase to 75.  C/O back stiffness in AM. Happens couple times a year and last a week or two. Just started this AM. The Voltarin gel is helping - takes the edge off. He is happy to cont it. Also rec tylenol if needed.

## 2013-01-13 NOTE — Assessment & Plan Note (Signed)
BP Readings from Last 3 Encounters:  01/13/13 142/90  11/18/12 133/81  05/22/12 117/79    Lab Results  Component Value Date   NA 137 11/18/2012   K 4.2 11/18/2012   CREATININE 1.58* 11/18/2012    Assessment:  Blood pressure control: mildly elevated  Progress toward BP goal:  unchanged  Comments: Slightly increased  Today. I forgot to recheck it later in visit.  Plan:  Medications:  continue current medications  Educational resources provided:    Self management tools provided: home blood pressure logbook  Other plans: Follow.

## 2013-01-13 NOTE — Assessment & Plan Note (Signed)
Well controlled. Cont statin.

## 2013-01-13 NOTE — Assessment & Plan Note (Signed)
Flu shot today 

## 2013-01-13 NOTE — Assessment & Plan Note (Signed)
No micro alb. No need for ACEI. Need to check micro yearly. I reviewed the letter I sent pt. Re enforced no NSAIDs.

## 2013-01-13 NOTE — Assessment & Plan Note (Signed)
Down to 2 cigs per day. Doesn't need any other assistance. Happy with his plan to quit.

## 2013-01-13 NOTE — Assessment & Plan Note (Signed)
Has appt Cards - [facility] - end of month. On BB, statin, and ASA. Asymptomatic. LDL, BP all controlled.

## 2013-01-13 NOTE — Assessment & Plan Note (Signed)
The elavil 50 was helping him sleep. Left him groggy for 30 - 60 min in AM but he is OK with that. Never increased dose to 100 so will increase to 75 today.

## 2013-01-13 NOTE — Assessment & Plan Note (Signed)
Flomax was started bc he was having nocturia. Now on Flomax and no more nocturia.

## 2013-03-02 ENCOUNTER — Encounter: Payer: Self-pay | Admitting: Internal Medicine

## 2013-03-02 ENCOUNTER — Ambulatory Visit (INDEPENDENT_AMBULATORY_CARE_PROVIDER_SITE_OTHER): Payer: Medicare HMO | Admitting: Internal Medicine

## 2013-03-02 VITALS — BP 147/91 | HR 99 | Temp 97.6°F | Ht 74.0 in | Wt 200.9 lb

## 2013-03-02 DIAGNOSIS — E1149 Type 2 diabetes mellitus with other diabetic neurological complication: Secondary | ICD-10-CM

## 2013-03-02 DIAGNOSIS — E119 Type 2 diabetes mellitus without complications: Secondary | ICD-10-CM

## 2013-03-02 DIAGNOSIS — M25561 Pain in right knee: Secondary | ICD-10-CM | POA: Insufficient documentation

## 2013-03-02 DIAGNOSIS — E1142 Type 2 diabetes mellitus with diabetic polyneuropathy: Secondary | ICD-10-CM

## 2013-03-02 DIAGNOSIS — M25569 Pain in unspecified knee: Secondary | ICD-10-CM

## 2013-03-02 LAB — GLUCOSE, CAPILLARY: Glucose-Capillary: 356 mg/dL — ABNORMAL HIGH (ref 70–99)

## 2013-03-02 LAB — POCT GLYCOSYLATED HEMOGLOBIN (HGB A1C): Hemoglobin A1C: 8.7

## 2013-03-02 MED ORDER — GLIPIZIDE 5 MG PO TABS: 5.0000 mg | ORAL_TABLET | Freq: Two times a day (BID) | ORAL | Status: DC

## 2013-03-02 NOTE — Assessment & Plan Note (Signed)
Lab Results  Component Value Date   HGBA1C 8.7 03/02/2013   HGBA1C 7.0 11/18/2012   HGBA1C 7.3 05/22/2012     Assessment:  Diabetes control: fair control  Progress toward A1C goal:  deteriorated  Comments: Says he is taking glipizide as prescribed - 5mg  daily  Plan:  Medications:  Increase his glipizide from 5 mg daily to 5 mg twice a day    Educational resources provided: brochure

## 2013-03-02 NOTE — Progress Notes (Signed)
Patient ID: Timothy Yu, male   DOB: [birth year], 62 y.o.   MRN: [id number]  Subjective:   Patient ID: Timothy Yu male   DOB: Dec 18, 1950 62 y.o.   MRN: [id number]  HPI: [name] is a 62 y.o. male with history of T2DM, CAD, peripheral neuropathy, CKD,  presenting to the clinic for evaluation of R knee pain. He reports that he fell walking down the stairs a few weeks ago and landed on his right medial knee. First, he noticed some swelling and pain with walking but that has improved. However, he has persistent right knee pain which bothers him mainly at night. He's not had difficulty ambulating. He says that the joint feels a little bit more swollen but that the swelling has gone down considerably. He is taking Tylenol #3 for the pain but tried no other therapies. Denies fever, chills, redness or warmth of joints, dizziness.   Past Medical History  Diagnosis Date  . Hyperlipidemia     Well controlled on statin  . Hypertension     Well controlled on HCTZ, ARB, and BB  . DM (diabetes mellitus) type II controlled, neurological manifestation     diagnosis 2007, oral tx only. Has peripheral neuropathy  . Coronary artery disease 06/2009    Cath 06/2009 : bare metal stent to mid RCA 2/2 severe occlusion. Mod non-obstructing dz LAD & circ. On BB, statin, and ASA.  . CKD (chronic kidney disease) stage 3, GFR 30-59 ml/min 2013    On ARB  . GERD (gastroesophageal reflux disease)     On PPI  . BPH (benign prostatic hyperplasia)     On Flomax  . Insomnia     Trial of elavil 2013  . Neuropathic pain of both legs     Started 2012 when he got steroid shot for L lumbar pain and started as needle was withdrawn. Intolerant of Gabapentin. On Elavil   Current Outpatient Prescriptions  Medication Sig Dispense Refill  . amitriptyline (ELAVIL) 75 MG tablet Take 1 tablet (75 mg total) by mouth at bedtime.  90 tablet  2  . cilostazol (PLETAL) 50 MG tablet TAKE 1 TABLET BY MOUTH TWICE DAILY  180  tablet  3  . diclofenac sodium (VOLTAREN) 1 % GEL Apply 1 application topically 4 (four) times daily.  300 g  11  . glipiZIDE (GLUCOTROL) 5 MG tablet Take 1 tablet (5 mg total) by mouth 2 (two) times daily before a meal.  180 tablet  3  . hydrochlorothiazide (MICROZIDE) 12.5 MG capsule TAKE 1 CAPSULE BY MOUTH EVERY DAY  90 capsule  3  . metoprolol succinate (TOPROL-XL) 50 MG 24 hr tablet TAKE 1 TABLET BY MOUTH DAILY  90 tablet  3  . Multiple Vitamin (MULTIVITAMIN) tablet Take 1 tablet by mouth daily.  90 tablet  3  . nitroGLYCERIN (NITROSTAT) 0.4 MG SL tablet Place 1 tablet (0.4 mg total) under the tongue every 5 (five) minutes as needed for chest pain.  30 tablet  11  . pantoprazole (PROTONIX) 40 MG tablet TAKE 1 TABLET BY MOUTH DAILY  90 tablet  3  . simvastatin (ZOCOR) 40 MG tablet TAKE 1 TABLET BY MOUTH AT BEDTIME  90 tablet  3  . Tamsulosin HCl (FLOMAX) 0.4 MG CAPS TAKE 1 CAPSULE BY MOUTH DAILY  90 capsule  3   No current facility-administered medications for this visit.   Family History  Problem Relation Age of Onset  . Diabetes Mother   . Heart attack Sister 22  .  Ulcers Father   . Colon cancer Neg Hx    History   Social History  . Marital Status: Divorced    Spouse Name: N/A    Number of Children: N/A  . Years of Education: N/A   Social History Main Topics  . Smoking status: Current Some Day Smoker -- 0.10 packs/day    Types: Cigarettes  . Smokeless tobacco: None     Comment: Pt trying to slowly decrease .  1 cig per day  . Alcohol Use: Yes  . Drug Use: No  . Sexually Active: None     Comment: disabled, single, from [place]   Other Topics Concern  . None   Social History Narrative   Patient gets regular exercise.   Caffeine use: occasionally   Review of Systems: 10 pt ROS performed, pertinent positives and negatives noted in HPI Objective:  Physical Exam: Filed Vitals:   03/02/13 0838  BP: 147/91  Pulse: 99  Temp: 97.6 F (36.4 C)  TempSrc: Oral   Height: 6\' 2"  (1.88 m)  Weight: 200 lb 14.4 oz (91.128 kg)  SpO2: 97%   Vitals reviewed. General:sitting in chair, NAD HEENT: PERRL, EOMI, no scleral icterus Cardiac: RRR, no rubs, murmurs or gallops Pulm: clear to auscultation bilaterally, no wheezes, rales, or rhonchi Abd: soft, nontender, nondistended, BS present Ext:  Right knee has small joint effusion noted. No overlying erythema or warmth. He is tender over medial compartment of knee inferomedially to the patella. Full range of motion. No pain or limitation with varus and valgus stress. Negative Lachmann. No patellar tenderness to palpation. Gait is normal. Neuro: alert and oriented X3, cranial nerves II-XII grossly intact, strength and sensation to light touch equal in bilateral upper and lower extremities  Assessment & Plan:   Please see problem-based charting for assessment and plan.

## 2013-03-02 NOTE — Assessment & Plan Note (Signed)
He has right medial knee pain, most prominently inferior to the patella. I think that this is most likely a right-sided medial collateral ligament sprain. He had no pain or limitation with varus and valgus stress of the knee, making a MCL tear less likely. Negative Lachman. No direct patellar tenderness to suggest fracture. Normal gait and no pain with ambulation. No fever, chills, joint erythema or large effusion to suggest septic arthritis. Anserine bursitis is also in the differential, as patient has most pain at night and likely underlying osteoarthritis. No significant joint effusion to tap today. The patient does not have any clinical indictation for radiographic imaging of the knee today. His symptoms have been gradually improving since the injury. We discussed conservative treatment as patient has not tried leg elevation or ice at this time. I also instructed him that he could put the Volatren gel that he uses for his low back pain on the knee for pain relief. He can also take acetaminophen. He cannot take oral NSAIDs due to renal insufficiency. Warning signs andsymptoms were discussed. Patient is to return to the clinic if symptoms do not improve or worsen.

## 2013-03-02 NOTE — Patient Instructions (Signed)
1. Please increase GLIPIZIDE (diabetes medicine) to twice a day. 2. For your knee, try to keep elevated at night and try using ice packs. You can take Tylenol for the pain, 650mg  four times a day. Try using your voltaren gel (the gel you use for your back) over your knee at night.  Come back if you are not getting any better in the next couple of weeks.  HOME CARE INSTRUCTIONS  Rest is needed to allow your body to heal. Routine activities can usually be resumed when comfortable. Injured tendons and bones can take up to 6 weeks to heal. Tendons are the cord-like structures that attach muscle to bone.  Ice following an injury helps keep the swelling down and reduces pain.  Put ice in a plastic bag.  Place a towel between your skin and the bag.  Leave the ice on for 15 to 20 minutes, 3 to 4 times a day. Do this while awake, for the first 24 to 48 hours. After that, continue as directed by your caregiver.  Elevation helps reduce swelling and decreases pain. With extremities, such as the arms, hands, legs, and feet, the injured area should be placed near or above the level of the heart, if possible. SEEK IMMEDIATE MEDICAL CARE IF:  You have persistent pain and swelling.  You develop redness, numbness, or unexpected weakness.  Your symptoms are getting worse rather than improving after several days. These symptoms may indicate that further evaluation or further X-rays are needed. Sometimes, X-rays may not show a small broken bone (fracture) until 1 week or 10 days later. Make a follow-up appointment with your caregiver. Ask when your X-ray results will be ready. Make sure you get your X-ray results. Document Released: 03/03/2001 Document Revised: 02/11/2012 Document Reviewed: 04/20/2011 [facility] Patient Information 2013 [place], [place].

## 2013-03-10 ENCOUNTER — Encounter: Payer: Self-pay | Admitting: Internal Medicine

## 2013-03-10 DIAGNOSIS — R269 Unspecified abnormalities of gait and mobility: Secondary | ICD-10-CM | POA: Insufficient documentation

## 2013-04-14 ENCOUNTER — Ambulatory Visit: Payer: Medicare HMO | Admitting: Internal Medicine

## 2013-05-12 ENCOUNTER — Ambulatory Visit: Payer: Medicare HMO | Admitting: Internal Medicine

## 2013-05-26 ENCOUNTER — Ambulatory Visit: Payer: Medicare HMO | Admitting: Internal Medicine

## 2013-06-11 ENCOUNTER — Other Ambulatory Visit: Payer: Self-pay

## 2013-06-16 ENCOUNTER — Encounter: Payer: Self-pay | Admitting: Internal Medicine

## 2013-06-16 ENCOUNTER — Encounter: Payer: Medicare HMO | Admitting: Internal Medicine

## 2013-07-21 ENCOUNTER — Encounter: Payer: Self-pay | Admitting: Internal Medicine

## 2013-07-21 ENCOUNTER — Ambulatory Visit (INDEPENDENT_AMBULATORY_CARE_PROVIDER_SITE_OTHER): Payer: Medicare HMO | Admitting: Internal Medicine

## 2013-07-21 VITALS — BP 120/80 | HR 89 | Temp 97.1°F | Ht 74.0 in | Wt 194.0 lb

## 2013-07-21 DIAGNOSIS — E119 Type 2 diabetes mellitus without complications: Secondary | ICD-10-CM

## 2013-07-21 DIAGNOSIS — I2510 Atherosclerotic heart disease of native coronary artery without angina pectoris: Secondary | ICD-10-CM

## 2013-07-21 DIAGNOSIS — E1149 Type 2 diabetes mellitus with other diabetic neurological complication: Secondary | ICD-10-CM

## 2013-07-21 DIAGNOSIS — E1142 Type 2 diabetes mellitus with diabetic polyneuropathy: Secondary | ICD-10-CM

## 2013-07-21 DIAGNOSIS — I10 Essential (primary) hypertension: Secondary | ICD-10-CM

## 2013-07-21 LAB — GLUCOSE, CAPILLARY: Glucose-Capillary: 130 mg/dL — ABNORMAL HIGH (ref 70–99)

## 2013-07-21 LAB — POCT GLYCOSYLATED HEMOGLOBIN (HGB A1C): Hemoglobin A1C: 7.2

## 2013-07-21 NOTE — Progress Notes (Signed)
Patient ID: Timothy Yu, male   DOB: 1951/04/28, 62 y.o.   MRN: [id number]   Subjective:   HPI: Timothy Yu is a 62 y.o. gentleman with past medical history of hypertension, diabetes, coronary artery disease,s/p stent placement in 2010, BPH, chronic in disease, among other health problems, presents for an annual exam.  Mr. Hitsman reports that 2 weeks ago he experienced an episode of shortness of breath on minimal exertion, which lasted for one week. The symptoms have completely resolved after one week. He remembers no history of chest pain, palpitations, orthopnea, PND, or lower extremity edema. No recent cough, fevers, chills, fatigue, or dizziness. Reports now is able to walk at least 1 mile before he gets short of breath, but 2 weeks back his walking had been reduced to only a quarter of a mile. During that time, the patient did not seek medical attention. The symptoms resolved on their own.  Please see the A&P for the status of the pt's chronic medical problems.    Past Medical History  Diagnosis Date  . Hyperlipidemia     Well controlled on statin  . Hypertension     Well controlled on HCTZ, ARB, and BB  . DM (diabetes mellitus) type II controlled, neurological manifestation     diagnosis 2007, oral tx only. Has peripheral neuropathy  . Coronary artery disease 06/2009    Cath 06/2009 : bare metal stent to mid RCA 2/2 severe occlusion. Mod non-obstructing dz LAD & circ. On BB, statin, and ASA.  . CKD (chronic kidney disease) stage 3, GFR 30-59 ml/min 2013    On ARB  . GERD (gastroesophageal reflux disease)     On PPI  . BPH (benign prostatic hyperplasia)     On Flomax  . Insomnia     Trial of elavil 2013  . Neuropathic pain of both legs     Started 2012 when he got steroid shot for L lumbar pain and started as needle was withdrawn. Intolerant of Gabapentin. On Elavil   Current Outpatient Prescriptions  Medication Sig Dispense Refill  . amitriptyline (ELAVIL) 75 MG  tablet Take 1 tablet (75 mg total) by mouth at bedtime.  90 tablet  2  . cilostazol (PLETAL) 50 MG tablet TAKE 1 TABLET BY MOUTH TWICE DAILY  180 tablet  3  . diclofenac sodium (VOLTAREN) 1 % GEL Apply 1 application topically 4 (four) times daily.  300 g  11  . glipiZIDE (GLUCOTROL) 5 MG tablet Take 1 tablet (5 mg total) by mouth 2 (two) times daily before a meal.  180 tablet  3  . hydrochlorothiazide (MICROZIDE) 12.5 MG capsule TAKE 1 CAPSULE BY MOUTH EVERY DAY  90 capsule  3  . Multiple Vitamin (MULTIVITAMIN) tablet Take 1 tablet by mouth daily.  90 tablet  3  . nitroGLYCERIN (NITROSTAT) 0.4 MG SL tablet Place 1 tablet (0.4 mg total) under the tongue every 5 (five) minutes as needed for chest pain.  30 tablet  11  . pantoprazole (PROTONIX) 40 MG tablet TAKE 1 TABLET BY MOUTH DAILY  90 tablet  3  . simvastatin (ZOCOR) 40 MG tablet TAKE 1 TABLET BY MOUTH AT BEDTIME  90 tablet  3  . Tamsulosin HCl (FLOMAX) 0.4 MG CAPS TAKE 1 CAPSULE BY MOUTH DAILY  90 capsule  3  . metoprolol succinate (TOPROL-XL) 50 MG 24 hr tablet TAKE 1 TABLET BY MOUTH DAILY  90 tablet  3   No current facility-administered medications for this visit.   Family  History  Problem Relation Age of Onset  . Diabetes Mother   . Heart attack Sister 18  . Ulcers Father   . Colon cancer Neg Hx    History   Social History  . Marital Status: Divorced    Spouse Name: N/A    Number of Children: N/A  . Years of Education: N/A   Social History Main Topics  . Smoking status: Current Some Day Smoker -- 0.10 packs/day    Types: Cigarettes  . Smokeless tobacco: None     Comment: Pt trying to slowly decrease .  1 cig per day  . Alcohol Use: Yes  . Drug Use: No  . Sexual Activity: None     Comment: disabled, single, from [place]   Other Topics Concern  . None   Social History Narrative   Patient gets regular exercise.   Caffeine use: occasionally   Review of Systems: Constitutional: Denies fever, chills, diaphoresis,  appetite change and fatigue.  Respiratory: Denies SOB, DOE, cough, chest tightness, and wheezing.  Cardiovascular: No chest pain, palpitations and leg swelling.  Gastrointestinal: No abdominal pain, nausea, vomiting, bloody stools Genitourinary: No dysuria, frequency, hematuria, or flank pain.  Musculoskeletal: No myalgias, back pain, joint swelling, arthralgias    Objective:  Physical Exam: Filed Vitals:   07/21/13 1529  BP: 120/80  Pulse: 89  Temp: 97.1 F (36.2 C)  TempSrc: Oral  Height: 6\' 2"  (1.88 m)  Weight: 194 lb (87.998 kg)  SpO2: 98%   General: Well nourished. No acute distress.  Lungs: CTA bilaterally. Heart: RRR; no extra sounds or murmurs  Abdomen: Non-distended, normal BS, soft, nontender; no hepatosplenomegaly  Extremities: No pedal edema. No joint swelling or tenderness. Neurologic: Alert and oriented x3. No obvious neurologic deficits.  Assessment & Plan:  I have discussed my assessment and plan  with Dr. Josem Kaufmann  as detailed under problem based charting.

## 2013-07-21 NOTE — Assessment & Plan Note (Signed)
BP Readings from Last 3 Encounters:  07/21/13 120/80  03/02/13 147/91  01/13/13 142/90    Lab Results  Component Value Date   NA 137 11/18/2012   K 4.2 11/18/2012   CREATININE 1.58* 11/18/2012    Assessment: Blood pressure control: controlled Progress toward BP goal:  at goal Comments: none - reports to not be taking his metoprolol. Did not bring his medication bottle today.  Plan: Medications:  continue current medications Educational resources provided: brochure;handout;video Self management tools provided:   Other plans: encouraged him to take metoprololol

## 2013-07-21 NOTE — Assessment & Plan Note (Signed)
It is unclear what the patient's present symptoms of shortness of breath are related to coronary artery disease. During his last visit on 01/13/2013, it was noted that he was supposed to see Dr. Jens Som at the end of Feb/2014. The patient reports that he never went for this appointment. Currently, I do not suspect an acute cardiac process, however, I would like him to followup with cardiology. Of note, the patient's medication list mentions metoprolol, 50 mg every 24 hours, however, the patient reports that he has not been taking this medication. I contacted his pharmacy who confirmed that this medication was mailed to his house on 7/31 2014.    Plan  -  refer the patient to Dr. Jens Som as earlier planned. - Encouraged the patient to take Metoprolol - he will follow up with Dr Rogelia Boga in 1-2 months

## 2013-07-21 NOTE — Assessment & Plan Note (Addendum)
Lab Results  Component Value Date   HGBA1C 7.2 07/21/2013   HGBA1C 8.7 03/02/2013   HGBA1C 7.0 11/18/2012     Assessment: Diabetes control: good control (HgbA1C at goal) Progress toward A1C goal:  at goal Comments: takes Glipizide 5 mg once a day. HbA1c is 7.2 today   Plan: Medications:  continue current medications Home glucose monitoring: Frequency: no home glucose monitoring Timing:   Instruction/counseling given: discussed the need for weight loss Educational resources provided: brochure;handout Self management tools provided:   Other plans: He takes his glipizide dose back to 5 mg once daily as opposed to twice daily

## 2013-07-21 NOTE — Patient Instructions (Signed)
General Instructions: Please take your Metoprolol as recommended. You need this medication for your heart. We will make an appointment with your a heart doctor. We will check your kidney function when you come to see Dr. Rogelia Boga in 4 months. Please continue taking your medications as prescribed.   Treatment Goals:  Goals (1 Years of Data) as of 07/21/13         As of Today 03/02/13 01/13/13 11/18/12 05/22/12     Blood Pressure    . Blood Pressure < 140/90  120/80 147/91 142/90 133/81 117/79     Lifestyle    . Prevent Falls           Result Component    . HEMOGLOBIN A1C < 7.0  7.2 8.7  7.0 7.3    . LDL CALC < 100     45       Progress Toward Treatment Goals:  Treatment Goal 07/21/2013  Hemoglobin A1C at goal  Blood pressure at goal  Stop smoking smoking less  Prevent falls improved    Self Care Goals & Plans:  Self Care Goal 07/21/2013  Manage my medications take my medicines as prescribed; bring my medications to every visit; refill my medications on time; follow the sick day instructions if I am sick  Monitor my health check my feet daily  Eat healthy foods eat more vegetables; eat fruit for snacks and desserts; drink diet soda or water instead of juice or soda  Be physically active take a walk every day; find an activity I enjoy  Stop smoking -    Home Blood Glucose Monitoring 07/21/2013  Check my blood sugar no home glucose monitoring     Care Management & Community Referrals:  Referral 01/13/2013  Referrals made for care management support none needed  Referrals made to community resources other (see comments)

## 2013-07-23 NOTE — Progress Notes (Signed)
Case discussed with Dr. Kazibwe at time of visit. We reviewed the resident's history and exam and pertinent patient test results. I agree with the assessment, diagnosis, and plan of care documented in the resident's note. 

## 2013-08-10 ENCOUNTER — Ambulatory Visit: Payer: Medicare HMO | Admitting: Physician Assistant

## 2013-08-26 ENCOUNTER — Other Ambulatory Visit: Payer: Self-pay | Admitting: Internal Medicine

## 2013-08-26 NOTE — Telephone Encounter (Signed)
i called the pharmacy, they did not have a record of either, so i told them to disregard any script for 25 mg that they might receive and gave them the new 75mg  script for #90 w/ 1 additional refill

## 2013-08-26 NOTE — Telephone Encounter (Signed)
Timothy Yu - I had increased to 75. The refill (which I signed) was for 25. Do you know if he is taking 75 or 25? I may need to redo the Rx bc didn't fully review prior to signing.

## 2013-10-15 ENCOUNTER — Encounter: Payer: Self-pay | Admitting: Internal Medicine

## 2013-10-15 ENCOUNTER — Encounter: Payer: Self-pay | Admitting: Physician Assistant

## 2013-10-15 ENCOUNTER — Ambulatory Visit (INDEPENDENT_AMBULATORY_CARE_PROVIDER_SITE_OTHER): Payer: Medicare HMO | Admitting: Physician Assistant

## 2013-10-15 ENCOUNTER — Encounter (INDEPENDENT_AMBULATORY_CARE_PROVIDER_SITE_OTHER): Payer: Self-pay

## 2013-10-15 VITALS — BP 124/90 | HR 80 | Ht 74.0 in | Wt 194.8 lb

## 2013-10-15 DIAGNOSIS — I10 Essential (primary) hypertension: Secondary | ICD-10-CM

## 2013-10-15 DIAGNOSIS — I2510 Atherosclerotic heart disease of native coronary artery without angina pectoris: Secondary | ICD-10-CM

## 2013-10-15 DIAGNOSIS — E785 Hyperlipidemia, unspecified: Secondary | ICD-10-CM

## 2013-10-15 DIAGNOSIS — R0602 Shortness of breath: Secondary | ICD-10-CM

## 2013-10-15 NOTE — Patient Instructions (Signed)
Your physician recommends that you continue on your current medications as directed. Please refer to the Current Medication list given to you today.  Your physician has requested that you have en exercise stress myoview. DX:  SOB, CAD For further information please visit [email or web address] Please follow instruction sheet, as given.  Your physician wants you to follow-up in: Dr. Jens Som in 6 months. You will receive a reminder letter in the mail two months in advance. If you don't receive a letter, please call our office to schedule the follow-up appointment.

## 2013-10-15 NOTE — Progress Notes (Signed)
[address] [place], [place]  [address] Phone: [phone] Fax:  [phone]  Date:  10/15/2013   ID:  Timothy Yu, DOB Jun 12, 1951, MRN [id number]  PCP:  Blanch Media, MD  Cardiologist:  Dr. Olga Millers     History of Present Illness: Timothy Yu is a 62 y.o. male with a history of CAD, T2DM, HTN, HL.  Myoview (05/2009): Inferior ischemia, EF 51%.  LHC (06/2009):  pLAD 30-40%, mLAD 50%, apical LAD 40%, mD1 65%, proximal and mid CFX 30%, OM1 30%, mOM2 50-60%, pRCA 20%, mRCA 95%, dCA 50%, oPL 40%, EF 60%. PCI: BMS to the mRCA. Last seen by Dr. Jens Som 10/2011.  Since last seen, he does note increased dyspnea with exertion. He probably describes NYHA class IIb symptoms. He denies orthopnea, PND or edema. He denies syncope. He denies chest discomfort.  Recent Labs: 11/18/2012: Creatinine 1.58*; HDL 46; LDL (calc) 45; Potassium 4.2   Wt Readings from Last 3 Encounters:  10/15/13 194 lb 12.8 oz (88.361 kg)  07/21/13 194 lb (87.998 kg)  03/02/13 200 lb 14.4 oz (91.128 kg)     Past Medical History  Diagnosis Date  . Hyperlipidemia     Well controlled on statin  . Hypertension     Well controlled on HCTZ, ARB, and BB  . DM (diabetes mellitus) type II controlled, neurological manifestation     diagnosis 2007, oral tx only. Has peripheral neuropathy  . Coronary artery disease 06/2009    Cath 06/2009 : bare metal stent to mid RCA 2/2 severe occlusion. Mod non-obstructing dz LAD & circ. On BB, statin, and ASA.  . CKD (chronic kidney disease) stage 3, GFR 30-59 ml/min 2013    On ARB  . GERD (gastroesophageal reflux disease)     On PPI  . BPH (benign prostatic hyperplasia)     On Flomax  . Insomnia     Trial of elavil 2013  . Neuropathic pain of both legs     Started 2012 when he got steroid shot for L lumbar pain and started as needle was withdrawn. Intolerant of Gabapentin. On Elavil    Current Outpatient Prescriptions  Medication Sig Dispense Refill   . amitriptyline (ELAVIL) 75 MG tablet TAKE 1 TABLET BY MOUTH EVERY NIGHT AT BEDTIME  90 tablet  1  . aspirin 81 MG tablet Take 81 mg by mouth daily.      . cilostazol (PLETAL) 50 MG tablet TAKE 1 TABLET BY MOUTH TWICE DAILY  180 tablet  3  . diclofenac sodium (VOLTAREN) 1 % GEL Apply 1 application topically 4 (four) times daily.  300 g  11  . glipiZIDE (GLUCOTROL) 5 MG tablet Take 1 tablet (5 mg total) by mouth 2 (two) times daily before a meal.  180 tablet  3  . hydrochlorothiazide (MICROZIDE) 12.5 MG capsule TAKE 1 CAPSULE BY MOUTH EVERY DAY  90 capsule  3  . metoprolol succinate (TOPROL-XL) 50 MG 24 hr tablet TAKE 1 TABLET BY MOUTH DAILY  90 tablet  3  . Multiple Vitamin (MULTIVITAMIN) tablet Take 1 tablet by mouth daily.  90 tablet  3  . nitroGLYCERIN (NITROSTAT) 0.4 MG SL tablet Place 1 tablet (0.4 mg total) under the tongue every 5 (five) minutes as needed for chest pain.  30 tablet  11  . pantoprazole (PROTONIX) 40 MG tablet TAKE 1 TABLET BY MOUTH DAILY  90 tablet  3  . simvastatin (ZOCOR) 40 MG tablet TAKE 1 TABLET BY MOUTH AT BEDTIME  90 tablet  3  . Tamsulosin HCl (FLOMAX) 0.4 MG CAPS TAKE 1 CAPSULE BY MOUTH DAILY  90 capsule  3   No current facility-administered medications for this visit.    Allergies:   Lisinopril; Gabapentin; and Metformin and related   Social History:  The patient  reports that he has been smoking Cigarettes.  He has been smoking about 0.10 packs per day. He does not have any smokeless tobacco history on file. He reports that he drinks alcohol. He reports that he does not use illicit drugs.   Family History:  The patient's family history includes Diabetes in his mother; Heart attack (age of onset: 74) in his sister; Ulcers in his father. There is no history of Colon cancer.   ROS:  Please see the history of present illness.   He denies fevers, chills, cough, wheezing, vomiting, diarrhea, melena, hematochezia.   All other systems reviewed and negative.    PHYSICAL EXAM: VS:  BP 124/90  Pulse 80  Ht 6\' 2"  (1.88 m)  Wt 194 lb 12.8 oz (88.361 kg)  BMI 25.00 kg/m2 Well nourished, well developed, in no acute distress HEENT: normal Neck: no JVD Vascular:  No carotid bruits Cardiac:  normal S1, S2; RRR; no murmur Lungs:  Decreased breath sounds bilaterally, no wheezing, rhonchi or rales Abd: soft, nontender, no hepatomegaly Ext: no edema Skin: warm and dry Neuro:  CNs 2-12 intact, no focal abnormalities noted  EKG:  NSR, HR 80, normal axis, iRBBB, no acute changes     ASSESSMENT AND PLAN:  1. CAD:  He does note increased dyspnea. When last seen by Dr. Jens Som, he recommended proceeding with a functional study in one year. I will arrange an ETT-Myoview. Continue aspirin, statin. 2. Dyspnea:  If his nuclear study is low-risk, consider further evaluation with PCP. He is a long-time smoker and COPD is certainly a consideration. 3. Hypertension:  Fair control. Continue to monitor. Continue current therapy. 4. Hyperlipidemia:  Followed by primary care. 5. CKD:  Renal function followed by primary care. 6. Tobacco Abuse:  He is trying to quit. 7. Disposition:  Follow up with Dr. Jens Som in 6 months or sooner if Myoview is abnormal.  Signed, Tereso Newcomer, PA-C  10/15/2013 9:13 AM

## 2013-10-20 ENCOUNTER — Ambulatory Visit (INDEPENDENT_AMBULATORY_CARE_PROVIDER_SITE_OTHER): Payer: Medicare HMO | Admitting: Internal Medicine

## 2013-10-20 ENCOUNTER — Encounter: Payer: Medicare HMO | Admitting: Internal Medicine

## 2013-10-20 ENCOUNTER — Encounter: Payer: Self-pay | Admitting: Internal Medicine

## 2013-10-20 VITALS — BP 147/98 | HR 81 | Temp 97.1°F | Ht 74.0 in | Wt 204.0 lb

## 2013-10-20 DIAGNOSIS — E1142 Type 2 diabetes mellitus with diabetic polyneuropathy: Secondary | ICD-10-CM

## 2013-10-20 DIAGNOSIS — G5793 Unspecified mononeuropathy of bilateral lower limbs: Secondary | ICD-10-CM

## 2013-10-20 DIAGNOSIS — I2510 Atherosclerotic heart disease of native coronary artery without angina pectoris: Secondary | ICD-10-CM

## 2013-10-20 DIAGNOSIS — F17200 Nicotine dependence, unspecified, uncomplicated: Secondary | ICD-10-CM

## 2013-10-20 DIAGNOSIS — G609 Hereditary and idiopathic neuropathy, unspecified: Secondary | ICD-10-CM

## 2013-10-20 DIAGNOSIS — G4700 Insomnia, unspecified: Secondary | ICD-10-CM

## 2013-10-20 DIAGNOSIS — N400 Enlarged prostate without lower urinary tract symptoms: Secondary | ICD-10-CM

## 2013-10-20 DIAGNOSIS — I10 Essential (primary) hypertension: Secondary | ICD-10-CM

## 2013-10-20 DIAGNOSIS — K219 Gastro-esophageal reflux disease without esophagitis: Secondary | ICD-10-CM

## 2013-10-20 DIAGNOSIS — E1149 Type 2 diabetes mellitus with other diabetic neurological complication: Secondary | ICD-10-CM

## 2013-10-20 DIAGNOSIS — Z23 Encounter for immunization: Secondary | ICD-10-CM

## 2013-10-20 DIAGNOSIS — E785 Hyperlipidemia, unspecified: Secondary | ICD-10-CM

## 2013-10-20 LAB — LIPID PANEL
LDL Cholesterol: 69 mg/dL (ref 0–99)
Triglycerides: 159 mg/dL — ABNORMAL HIGH (ref <150)

## 2013-10-20 LAB — GLUCOSE, CAPILLARY: Glucose-Capillary: 154 mg/dL — ABNORMAL HIGH (ref 70–99)

## 2013-10-20 LAB — BASIC METABOLIC PANEL WITH GFR
BUN: 16 mg/dL (ref 6–23)
CO2: 26 mEq/L (ref 19–32)
Calcium: 9.6 mg/dL (ref 8.4–10.5)
Chloride: 101 mEq/L (ref 96–112)
GFR, Est African American: 51 mL/min — ABNORMAL LOW
Glucose, Bld: 112 mg/dL — ABNORMAL HIGH (ref 70–99)
Potassium: 4.2 mEq/L (ref 3.5–5.3)
Sodium: 138 mEq/L (ref 135–145)

## 2013-10-20 LAB — POCT GLYCOSYLATED HEMOGLOBIN (HGB A1C): Hemoglobin A1C: 7.3

## 2013-10-20 MED ORDER — AMITRIPTYLINE HCL 25 MG PO TABS: 25.0000 mg | ORAL_TABLET | Freq: Every day | ORAL | Status: DC

## 2013-10-20 NOTE — Assessment & Plan Note (Signed)
He has his myoview sch on Dec4th. He remains on his ASA, statin, and BB.

## 2013-10-20 NOTE — Assessment & Plan Note (Signed)
I am checking Cr and microabl today since he will need an MRI.

## 2013-10-20 NOTE — Assessment & Plan Note (Signed)
Has had DOE for 4-5 months. Gets winded and tired with one flight of stairs but these sxs comes and goes. Agrees to PFT's.

## 2013-10-20 NOTE — Assessment & Plan Note (Signed)
BP Readings from Last 3 Encounters:  10/20/13 147/98  10/15/13 124/90  07/21/13 120/80  BP trending up but has always been OK. Con HCTZ 12.5, Toprol 50. If still high next appt, increased HCTZ to 25. BMP today.

## 2013-10-20 NOTE — Assessment & Plan Note (Addendum)
Elavil 75 initially work well but now he has trouble falling asleep. Goes to bed at 9 PM and falls asleep about 11PM. Wakes up refreshed 5 AM. No naps during day. Not tired. No falling asleep at red lights, etc. Even though he is not tired, it is distressing to him not to fall asleep when he goes to bed. Therefore, and since there is another indication for elavil, will increase to 100 mg QHS.

## 2013-10-20 NOTE — Assessment & Plan Note (Signed)
Asymptomatic on Flomax

## 2013-10-20 NOTE — Assessment & Plan Note (Addendum)
On Simva 40. FLP today. Last LDL 45 in Dec 2013.

## 2013-10-20 NOTE — Assessment & Plan Note (Addendum)
Sxs for at least 2 yrs. See overview. Constant numbness of the foot and coldness. He has great Doppler pulse. Pain "now and then". Numbness increases if lays on L side. Has never had MRI. Not claustrophobic. The elavil is not helping leg. Will increase to 100. He has not responded to oral treatments. If we do MRI and if there is a finding that can be treated with an injection, he would be willing to undergo an injection. Therefore, will do MRI.

## 2013-10-20 NOTE — Assessment & Plan Note (Addendum)
Lab Results  Component Value Date   HGBA1C 7.3 10/20/2013   HGBA1C 7.2 07/21/2013   HGBA1C 8.7 03/02/2013    His A1C remains decent on Glucotrol 5 BID. He has no lows. He does not check CBG and that is OK as A1 decent and no lows. He will get flu today and will sch an optho appt.

## 2013-10-20 NOTE — Progress Notes (Signed)
  Subjective:    Patient ID: Timothy Yu, male    DOB: [birth year], 62 y.o.   MRN: [id number]  Diabetes Pertinent negatives for diabetes include no chest pain, no fatigue and no weakness.  Extremity Weakness     Please see the A&P for the status of the pt's chronic medical problems.  Review of Systems  Constitutional: Negative for activity change, appetite change and fatigue.  HENT: Negative for rhinorrhea.   Eyes: Negative for itching.  Respiratory: Positive for shortness of breath.   Cardiovascular: Negative for chest pain and leg swelling.       No claudication  Gastrointestinal: Negative for diarrhea and constipation.  Genitourinary: Negative for difficulty urinating.  Musculoskeletal: Positive for back pain and extremity weakness.       L leg constant numbness and intermittent pain  Skin: Negative for wound.  Neurological: Negative for weakness.  Psychiatric/Behavioral: Positive for sleep disturbance.       Objective:   Physical Exam  Constitutional: He is oriented to person, place, and time. He appears well-developed and well-nourished. No distress.  HENT:  Head: Normocephalic and atraumatic.  Right Ear: External ear normal.  Left Ear: External ear normal.  Eyes: Conjunctivae and EOM are normal.  Cardiovascular:  I could not appreciate DP pulses. Triphasic pulses B DP by Doppler  Musculoskeletal: Normal range of motion. He exhibits no edema and no tenderness.  Neurological: He is alert and oriented to person, place, and time.  Skin: Skin is warm and dry. No rash noted. He is not diaphoretic. No erythema. No pallor.  Psychiatric: He has a normal mood and affect. His behavior is normal. Judgment and thought content normal.          Assessment & Plan:

## 2013-10-20 NOTE — Patient Instructions (Signed)
1. I will schedule your MRI 2. I will schedule your breathing test 3. Take one 75 mg pill and one 25 mg pill of Elavil to total 100mg  nightly. Call me and let me know how the 100 mg is doing 4. See Dr Lavona Mound

## 2013-10-20 NOTE — Assessment & Plan Note (Signed)
Asymptomatic on his PPI. 

## 2013-10-21 LAB — MICROALBUMIN / CREATININE URINE RATIO
Microalb Creat Ratio: 6.2 mg/g (ref 0.0–30.0)
Microalb, Ur: 0.5 mg/dL (ref 0.00–1.89)

## 2013-10-22 ENCOUNTER — Encounter: Payer: Self-pay | Admitting: Internal Medicine

## 2013-10-28 ENCOUNTER — Encounter: Payer: Self-pay | Admitting: Cardiology

## 2013-11-05 ENCOUNTER — Ambulatory Visit (HOSPITAL_COMMUNITY): Payer: Medicare HMO | Attending: Physician Assistant | Admitting: Radiology

## 2013-11-05 ENCOUNTER — Encounter: Payer: Self-pay | Admitting: Cardiology

## 2013-11-05 VITALS — BP 144/92 | HR 79 | Ht 74.0 in | Wt 191.0 lb

## 2013-11-05 DIAGNOSIS — Z8249 Family history of ischemic heart disease and other diseases of the circulatory system: Secondary | ICD-10-CM | POA: Insufficient documentation

## 2013-11-05 DIAGNOSIS — I2510 Atherosclerotic heart disease of native coronary artery without angina pectoris: Secondary | ICD-10-CM | POA: Insufficient documentation

## 2013-11-05 DIAGNOSIS — R0989 Other specified symptoms and signs involving the circulatory and respiratory systems: Secondary | ICD-10-CM | POA: Insufficient documentation

## 2013-11-05 DIAGNOSIS — F17200 Nicotine dependence, unspecified, uncomplicated: Secondary | ICD-10-CM | POA: Insufficient documentation

## 2013-11-05 DIAGNOSIS — R0609 Other forms of dyspnea: Secondary | ICD-10-CM | POA: Insufficient documentation

## 2013-11-05 DIAGNOSIS — I10 Essential (primary) hypertension: Secondary | ICD-10-CM | POA: Insufficient documentation

## 2013-11-05 DIAGNOSIS — R0602 Shortness of breath: Secondary | ICD-10-CM

## 2013-11-05 DIAGNOSIS — E785 Hyperlipidemia, unspecified: Secondary | ICD-10-CM | POA: Insufficient documentation

## 2013-11-05 DIAGNOSIS — E119 Type 2 diabetes mellitus without complications: Secondary | ICD-10-CM | POA: Insufficient documentation

## 2013-11-05 MED ORDER — TECHNETIUM TC 99M SESTAMIBI GENERIC - CARDIOLITE
30.0000 | Freq: Once | INTRAVENOUS | Status: AC | PRN
  Administered 2013-11-05: 30 via INTRAVENOUS

## 2013-11-05 MED ORDER — TECHNETIUM TC 99M SESTAMIBI GENERIC - CARDIOLITE
10.0000 | Freq: Once | INTRAVENOUS | Status: AC | PRN
  Administered 2013-11-05: 10 via INTRAVENOUS

## 2013-11-05 NOTE — Progress Notes (Signed)
[facility] SITE 3 NUCLEAR MED [address] [place], [place] [address] [phone]    Cardiology Nuclear Med Study  Timothy Yu is a 62 y.o. male     MRN : [id number]     DOB: 1951-02-14  Procedure Date: 11/05/2013  Nuclear Med Background Indication for Stress Test:  Evaluation for Ischemia and Stent Patency History:  CAD, Cath, Stent to RCA, MPI 2010 (ischemia) EF 51% Cardiac Risk Factors: Family History - CAD, Hypertension, Lipids, NIDDM and Smoker  Symptoms:  DOE   Nuclear Pre-Procedure Caffeine/Decaff Intake:  None NPO After: 12 noon   Lungs:  clear O2 Sat: 93% on room air. IV 0.9% NS with Angio Cath:  22g  IV Site: R Antecubital  IV Started by:  Bonnita Levan, RN  Chest Size (in):  44 Cup Size: n/a  Height: 6\' 2"  (1.88 m)  Weight:  191 lb (86.637 kg)  BMI:  Body mass index is 24.51 kg/(m^2). Tech Comments:  Toprol held x 24 hrs    Nuclear Med Study 1 or 2 day study: 1 day  Stress Test Type:  Stress  Reading MD: Tobias Alexander, MD  Order Authorizing Provider:  Olga Millers, MD  Resting Radionuclide: Technetium 74m Sestamibi  Resting Radionuclide Dose: 11.0 mCi   Stress Radionuclide:  Technetium 82m Sestamibi  Stress Radionuclide Dose: 32.5 mCi           Stress Protocol Rest HR: 79 Stress HR: 142  Rest BP: 144/93 Stress BP: 170/89  Exercise Time (min): 7:10 METS: 8.8   Predicted Max HR: 156 bpm % Max HR: 91.03 bpm Rate Pressure Product: 95188   Dose of Adenosine (mg):  n/a Dose of Lexiscan: n/a mg  Dose of Atropine (mg): n/a Dose of Dobutamine: n/a mcg/kg/min (at max HR)  Stress Test Technologist: Floetta Brickey Chimes, BS-ES  Nuclear Technologist:  Harlow Asa, CNMT     Rest Procedure:  Myocardial perfusion imaging was performed at rest 45 minutes following the intravenous administration of Technetium 2m Sestamibi. Rest ECG: NSR - Normal EKG  Stress Procedure:  The patient exercised on the treadmill utilizing the Bruce Protocol for 7:10  minutes. The patient stopped due to fatigue, SOB and denied any chest pain.  Technetium 34m Sestamibi was injected at peak exercise and myocardial perfusion imaging was performed after a brief delay. Stress ECG: No significant change from baseline ECG  QPS Raw Data Images:  Normal; no motion artifact; normal heart/lung ratio. Stress Images:  There is decreased uptake in the basal and mid inferior and inferolateral walls. Rest Images:  There is decreased uptake in the basal and mid inferior and basal inferolateral walls. Subtraction (SDS):  Reversible defect in the mid inferolateral wall. SDS 2. Transient Ischemic Dilatation (Normal <1.22):  0.76 Lung/Heart Ratio (Normal <0.45):  0.29  Quantitative Gated Spect Images QGS EDV:  87 ml QGS ESV:  39 ml  Impression Exercise Capacity:  Good exercise capacity. BP Response:  Normal blood pressure response. Clinical Symptoms:  No significant symptoms noted. ECG Impression:  No significant ST segment change suggestive of ischemia. Comparison with Prior Nuclear Study: No images to compare  Overall Impression:  Low risk stress nuclear study with a scar in the RCA territory with possible small and mild periinfarct ischemia. This might be affected by significant extracardiac uptake.  LV Ejection Fraction: 55%.  LV Wall Motion:  Hypokinesis of the basal and mid inferior and basal inferolateral walls.   Tobias Alexander, H 11/05/2013

## 2013-11-06 ENCOUNTER — Encounter: Payer: Self-pay | Admitting: Physician Assistant

## 2013-11-12 ENCOUNTER — Other Ambulatory Visit: Payer: Self-pay | Admitting: Internal Medicine

## 2013-11-12 MED ORDER — AMITRIPTYLINE HCL 100 MG PO TABS: 100.0000 mg | ORAL_TABLET | Freq: Every day | ORAL | Status: DC

## 2013-11-12 NOTE — Telephone Encounter (Signed)
Pt had combined the 25 and 75 for a total 100 mg dose. I cancelled the other 2 strengths and Rx 100 mg.

## 2014-01-20 ENCOUNTER — Ambulatory Visit (HOSPITAL_COMMUNITY)
Admission: RE | Admit: 2014-01-20 | Discharge: 2014-01-20 | Disposition: A | Discharge status: Home / Self Care | Payer: Medicare HMO | Source: Ambulatory Visit | Attending: Internal Medicine | Admitting: Internal Medicine

## 2014-01-20 ENCOUNTER — Encounter: Payer: Self-pay | Admitting: Internal Medicine

## 2014-01-20 ENCOUNTER — Other Ambulatory Visit: Payer: Self-pay | Admitting: Internal Medicine

## 2014-01-20 ENCOUNTER — Ambulatory Visit (HOSPITAL_COMMUNITY): Payer: Medicare HMO

## 2014-01-20 DIAGNOSIS — R0989 Other specified symptoms and signs involving the circulatory and respiratory systems: Secondary | ICD-10-CM | POA: Insufficient documentation

## 2014-01-20 DIAGNOSIS — R0609 Other forms of dyspnea: Secondary | ICD-10-CM | POA: Insufficient documentation

## 2014-01-20 DIAGNOSIS — F17200 Nicotine dependence, unspecified, uncomplicated: Secondary | ICD-10-CM | POA: Insufficient documentation

## 2014-01-20 DIAGNOSIS — E1149 Type 2 diabetes mellitus with other diabetic neurological complication: Secondary | ICD-10-CM

## 2014-01-20 DIAGNOSIS — R0602 Shortness of breath: Secondary | ICD-10-CM | POA: Insufficient documentation

## 2014-01-20 LAB — PULMONARY FUNCTION TEST
DL/VA % pred: 75 %
DL/VA: 3.62 ml/min/mmHg/L
DLCO cor % pred: 55 %
DLCO cor: 20.19 ml/min/mmHg
DLCO unc % pred: 55 %
DLCO unc: 20.19 ml/min/mmHg
FEF 25-75 Post: 1.9 L/sec
FEF 25-75 Pre: 1.21 L/sec
FEF2575-%Change-Post: 57 %
FEF2575-%Pred-Post: 63 %
FEF2575-%Pred-Pre: 40 %
FEV1-%Change-Post: 8 %
FEV1-%PRED-POST: 88 %
FEV1-%PRED-PRE: 81 %
FEV1-POST: 3 L
FEV1-Pre: 2.76 L
FEV1FVC-%CHANGE-POST: 1 %
FEV1FVC-%Pred-Pre: 85 %
FEV6-%Change-Post: 8 %
FEV6-%Pred-Post: 99 %
FEV6-%Pred-Pre: 91 %
FEV6-PRE: 3.89 L
FEV6-Post: 4.22 L
FEV6FVC-%Change-Post: 1 %
FEV6FVC-%PRED-PRE: 96 %
FEV6FVC-%Pred-Post: 97 %
FVC-%Change-Post: 6 %
FVC-%Pred-Post: 102 %
FVC-%Pred-Pre: 95 %
FVC-PRE: 4.22 L
FVC-Post: 4.49 L
POST FEV1/FVC RATIO: 67 %
POST FEV6/FVC RATIO: 94 %
PRE FEV6/FVC RATIO: 92 %
Pre FEV1/FVC ratio: 66 %
RV % PRED: 94 %
RV: 2.35 L
TLC % PRED: 89 %
TLC: 6.84 L

## 2014-01-20 MED ORDER — ALBUTEROL SULFATE HFA 108 (90 BASE) MCG/ACT IN AERS: 2.0000 | INHALATION_SPRAY | Freq: Four times a day (QID) | RESPIRATORY_TRACT | Status: AC | PRN

## 2014-01-20 MED ORDER — ALBUTEROL SULFATE (2.5 MG/3ML) 0.083% IN NEBU
2.5000 mg | INHALATION_SOLUTION | Freq: Once | RESPIRATORY_TRACT | Status: AC
  Administered 2014-01-20: 2.5 mg via RESPIRATORY_TRACT

## 2014-01-20 NOTE — Progress Notes (Signed)
Discussed PFT results. + DOE. Rx albuterol MDI PRN. Discussed proper method. Has May F/U.

## 2014-01-25 ENCOUNTER — Telehealth: Payer: Self-pay | Admitting: Internal Medicine

## 2014-01-25 NOTE — Addendum Note (Signed)
Addended by: Neomia DearPOWERS, Adisa Vigeant E on: 01/25/2014 06:07 PM   Modules accepted: Orders

## 2014-01-25 NOTE — Telephone Encounter (Signed)
Called Dr. Laruth BouchardGroat's office this patient has been sch for June 2014, July 2014, August 2014, October 2014 and 01/20/2014 and has no showed every appointment.  Patient is is not sch for any appointments at this time for Dr. Dione BoozeGroat.

## 2014-01-26 ENCOUNTER — Encounter: Payer: Self-pay | Admitting: Internal Medicine

## 2014-02-01 ENCOUNTER — Other Ambulatory Visit: Payer: Self-pay | Admitting: Internal Medicine

## 2014-02-04 ENCOUNTER — Other Ambulatory Visit: Payer: Self-pay | Admitting: Internal Medicine

## 2014-02-25 ENCOUNTER — Other Ambulatory Visit: Payer: Self-pay | Admitting: *Deleted

## 2014-02-25 DIAGNOSIS — I2510 Atherosclerotic heart disease of native coronary artery without angina pectoris: Secondary | ICD-10-CM

## 2014-02-25 MED ORDER — NITROGLYCERIN 0.4 MG SL SUBL
0.4000 mg | SUBLINGUAL_TABLET | SUBLINGUAL | Status: AC | PRN
Start: 2014-02-25 — End: ?

## 2014-04-20 ENCOUNTER — Encounter: Payer: Self-pay | Admitting: Internal Medicine

## 2014-04-20 ENCOUNTER — Ambulatory Visit: Payer: Medicare HMO | Admitting: Internal Medicine

## 2014-04-20 ENCOUNTER — Ambulatory Visit: Payer: Medicare HMO | Admitting: Dietician

## 2014-04-27 ENCOUNTER — Ambulatory Visit: Payer: Medicare HMO | Admitting: Internal Medicine

## 2014-04-27 ENCOUNTER — Ambulatory Visit: Payer: Medicare HMO | Admitting: Dietician

## 2014-05-25 ENCOUNTER — Encounter: Payer: Medicare HMO | Admitting: Internal Medicine

## 2014-05-25 ENCOUNTER — Encounter: Payer: Self-pay | Admitting: Internal Medicine

## 2014-06-03 ENCOUNTER — Encounter: Payer: Self-pay | Admitting: Dietician

## 2014-06-03 ENCOUNTER — Ambulatory Visit (INDEPENDENT_AMBULATORY_CARE_PROVIDER_SITE_OTHER): Payer: Commercial Managed Care - HMO | Admitting: Internal Medicine

## 2014-06-03 VITALS — BP 129/84 | HR 108 | Temp 98.8°F | Wt 194.7 lb

## 2014-06-03 DIAGNOSIS — K219 Gastro-esophageal reflux disease without esophagitis: Secondary | ICD-10-CM

## 2014-06-03 DIAGNOSIS — G4700 Insomnia, unspecified: Secondary | ICD-10-CM

## 2014-06-03 DIAGNOSIS — E1149 Type 2 diabetes mellitus with other diabetic neurological complication: Secondary | ICD-10-CM

## 2014-06-03 DIAGNOSIS — E785 Hyperlipidemia, unspecified: Secondary | ICD-10-CM

## 2014-06-03 DIAGNOSIS — G5790 Unspecified mononeuropathy of unspecified lower limb: Secondary | ICD-10-CM

## 2014-06-03 DIAGNOSIS — N183 Chronic kidney disease, stage 3 unspecified: Secondary | ICD-10-CM

## 2014-06-03 DIAGNOSIS — I129 Hypertensive chronic kidney disease with stage 1 through stage 4 chronic kidney disease, or unspecified chronic kidney disease: Secondary | ICD-10-CM

## 2014-06-03 DIAGNOSIS — Z0000 Encounter for general adult medical examination without abnormal findings: Secondary | ICD-10-CM

## 2014-06-03 DIAGNOSIS — I2510 Atherosclerotic heart disease of native coronary artery without angina pectoris: Secondary | ICD-10-CM

## 2014-06-03 DIAGNOSIS — M7702 Medial epicondylitis, left elbow: Secondary | ICD-10-CM

## 2014-06-03 DIAGNOSIS — N400 Enlarged prostate without lower urinary tract symptoms: Secondary | ICD-10-CM

## 2014-06-03 DIAGNOSIS — I10 Essential (primary) hypertension: Secondary | ICD-10-CM

## 2014-06-03 DIAGNOSIS — G5793 Unspecified mononeuropathy of bilateral lower limbs: Secondary | ICD-10-CM

## 2014-06-03 DIAGNOSIS — J449 Chronic obstructive pulmonary disease, unspecified: Secondary | ICD-10-CM

## 2014-06-03 DIAGNOSIS — G609 Hereditary and idiopathic neuropathy, unspecified: Secondary | ICD-10-CM

## 2014-06-03 LAB — POCT GLYCOSYLATED HEMOGLOBIN (HGB A1C): Hemoglobin A1C: 7.1

## 2014-06-03 LAB — BASIC METABOLIC PANEL WITH GFR
BUN: 16 mg/dL (ref 6–23)
CO2: 26 mEq/L (ref 19–32)
Calcium: 9.5 mg/dL (ref 8.4–10.5)
Chloride: 103 mEq/L (ref 96–112)
Creat: 1.49 mg/dL — ABNORMAL HIGH (ref 0.50–1.35)
GFR, EST AFRICAN AMERICAN: 57 mL/min — AB
GFR, Est Non African American: 49 mL/min — ABNORMAL LOW
Glucose, Bld: 69 mg/dL — ABNORMAL LOW (ref 70–99)
POTASSIUM: 3.9 meq/L (ref 3.5–5.3)
SODIUM: 137 meq/L (ref 135–145)

## 2014-06-03 LAB — HM DIABETES EYE EXAM

## 2014-06-03 LAB — GLUCOSE, CAPILLARY: GLUCOSE-CAPILLARY: 145 mg/dL — AB (ref 70–99)

## 2014-06-03 MED ORDER — DICLOFENAC SODIUM 1 % TD GEL: 2.0000 g | Freq: Four times a day (QID) | TRANSDERMAL | Status: DC

## 2014-06-03 NOTE — Assessment & Plan Note (Signed)
Taking 75 mg Elavil QHS and likes the results in regards to sleeping but does make him slightly N in AM and gives him dry mouth. Believes the benefits of the meds (better sleeping) outweighs the risks / side effects so will cont 75.

## 2014-06-03 NOTE — Assessment & Plan Note (Signed)
On BB, statin, ASA. No CP.

## 2014-06-03 NOTE — Progress Notes (Signed)
   Subjective:    Patient ID: Timothy Yu, male    DOB: 06-11-1951, 63 y.o.   MRN: [id number]  Diabetes Pertinent negatives for hypoglycemia include no dizziness. Pertinent negatives for diabetes include no chest pain, no polyuria and no weakness.    Please see the A&P for the status of the pt's chronic medical problems.   Review of Systems  Constitutional: Negative for activity change, appetite change and unexpected weight change.  HENT:       Dry mouth  Eyes: Negative for redness.  Respiratory: Positive for shortness of breath.        Occ use of MDI  Cardiovascular: Negative for chest pain and leg swelling.  Gastrointestinal: Positive for nausea.       N in AM  Endocrine: Negative for polyuria.  Genitourinary: Negative for frequency.       No BPH sxs - dribbling, hesitancy, freq, incomplete voiding  Musculoskeletal: Negative for gait problem.       L elbow pain  Skin:       Lump thoracic back  Neurological: Negative for dizziness, weakness and light-headedness.       Chronic numbness R first three toes.  Psychiatric/Behavioral: Positive for sleep disturbance.       Objective:   Physical Exam  Constitutional: He is oriented to person, place, and time. He appears well-developed and well-nourished. No distress.  HENT:  Head: Normocephalic and atraumatic.  Right Ear: External ear normal.  Left Ear: External ear normal.  Nose: Nose normal.  Eyes: Conjunctivae and EOM are normal. Right eye exhibits no discharge. Left eye exhibits no discharge. No scleral icterus.  Cardiovascular: Normal rate, regular rhythm and normal heart sounds.   Pulmonary/Chest: Effort normal and breath sounds normal.  Decreased throughout but no wheezing  Musculoskeletal: Normal range of motion. He exhibits no edema and no tenderness.  Neurological: He is alert and oriented to person, place, and time.  Skin: Skin is warm and dry. He is not diaphoretic.  1 cm lipoma, non tender mid cervical back    Psychiatric: He has a normal mood and affect. His behavior is normal. Judgment and thought content normal.          Assessment & Plan:

## 2014-06-03 NOTE — Assessment & Plan Note (Signed)
He is trying to cut down smoking. Now about 2 cigs daily, He no longer buys cigs - just bums off friends. He understands PFT's show COPD and best thing is to stop smoking. He has alb MDI and uses it occasionally. No DOE.

## 2014-06-03 NOTE — Assessment & Plan Note (Addendum)
He cont on his glucotrol 5 mg QD. He has testing supplies but does not check CBG at home. No sxs of hypoglycemia. His A1C is 7.1 today - acceptable. He has no showed 5 appt with Dr Dione BoozeGroat so will do retinal scanner today. Understands he needs to see optho for glaucoma. Foot exam today good except toenail fungus.  Lab Results  Component Value Date   HGBA1C 7.1 06/03/2014   HGBA1C 7.3 10/20/2013   HGBA1C 7.2 07/21/2013     Assessment: Diabetes control: good control (HgbA1C at goal) Progress toward A1C goal:  at goal Comments: See above  Plan: Medications:  continue current medications Home glucose monitoring: Frequency: no home glucose monitoring Timing: N/A Instruction/counseling given: reminded to get eye exam Educational resources provided: brochure Self management tools provided:   Other plans: See above

## 2014-06-03 NOTE — Assessment & Plan Note (Signed)
Asymptomatic on PPI. Cont med.

## 2014-06-03 NOTE — Assessment & Plan Note (Signed)
Avoids oral NSAID's. Will check BMP today. No microalb 10/2013.

## 2014-06-03 NOTE — Assessment & Plan Note (Addendum)
He has a lipoma on thoracic back that is now itchy. He will call me if wants referral to gen surgery or derm.   L elbow pain for 3-4 weeks. No injury / overuse. Pain is "every now and then" and worse when he bends elbow. Had similar but more severe pain in 1999 and got cortisone injection. Good L radial pulse. Good cap refill. Pain over medial epicondyle. No olecranon bursitis. Wil lstart with IBU 200 - 400 mg TID for 5 days only owing to renal inusff. To use topical NSAID. I did not write these on VS but provided verbally. Pt wants to get IBU OTC.

## 2014-06-03 NOTE — Assessment & Plan Note (Signed)
His BP is well controlled on HCTZ 12.5 and toprol XL 50. He does not need an ACEI as no microalb. BMP today.  BP Readings from Last 3 Encounters:  06/03/14 129/84  11/05/13 144/92  10/20/13 147/98    Lab Results  Component Value Date   NA 138 10/20/2013   K 4.2 10/20/2013   CREATININE 1.63* 10/20/2013    Assessment: Blood pressure control: controlled Progress toward BP goal:  at goal Comments: See above  Plan: Medications:  continue current medications Educational resources provided: brochure Self management tools provided:   Other plans: See above

## 2014-06-03 NOTE — Assessment & Plan Note (Signed)
Unchanged - numbness of L 3 medial toes - understands it is chronic.

## 2014-06-03 NOTE — Patient Instructions (Signed)
1. I will mail you your test results. 2. Even thought we did an eye picture today, you still need to see the eye doctor. Please make an appt. 3. Your diabetes and high blood pressure is good. Continue the good work! 4. Call me if you want a referral to get the lump on your back removed.

## 2014-06-03 NOTE — Assessment & Plan Note (Signed)
Asymptomatic on Flomax. Cont med.

## 2014-06-03 NOTE — Assessment & Plan Note (Signed)
His LDL is at target - was checked Nov 2014 and 69. Cont Zocor 40.

## 2014-06-04 ENCOUNTER — Encounter: Payer: Self-pay | Admitting: Internal Medicine

## 2014-06-22 ENCOUNTER — Other Ambulatory Visit: Payer: Self-pay | Admitting: *Deleted

## 2014-06-22 NOTE — Telephone Encounter (Signed)
If these are the same, pls have him call the new pharmacy and transfer the scripts.

## 2014-06-24 NOTE — Addendum Note (Signed)
Addended by: Bufford SpikesFULCHER, Bexleigh Theriault N on: 06/24/2014 04:56 PM   Modules accepted: Orders

## 2014-06-30 ENCOUNTER — Encounter: Payer: Self-pay | Admitting: Internal Medicine

## 2014-06-30 DIAGNOSIS — H409 Unspecified glaucoma: Secondary | ICD-10-CM | POA: Insufficient documentation

## 2014-06-30 NOTE — Telephone Encounter (Signed)
Have left pt message

## 2014-07-09 ENCOUNTER — Telehealth: Payer: Self-pay | Admitting: Internal Medicine

## 2014-07-12 ENCOUNTER — Encounter: Payer: Self-pay | Admitting: Internal Medicine

## 2014-08-05 ENCOUNTER — Telehealth: Payer: Self-pay | Admitting: Internal Medicine

## 2014-08-05 NOTE — Telephone Encounter (Signed)
Called Dr. Laruth Bouchard office and this patient was sch for June July and August of 2015 and no showed all appointments.

## 2014-08-31 ENCOUNTER — Encounter: Payer: Self-pay | Admitting: *Deleted

## 2014-10-05 NOTE — Telephone Encounter (Signed)
OPENED IN [place]

## 2014-11-03 ENCOUNTER — Other Ambulatory Visit: Payer: Self-pay | Admitting: Internal Medicine

## 2014-11-04 NOTE — Telephone Encounter (Signed)
Never increased from 75 to 100 and liked 75. Refilled 75.

## 2014-11-18 ENCOUNTER — Ambulatory Visit: Payer: Commercial Managed Care - HMO | Admitting: Internal Medicine

## 2014-12-02 ENCOUNTER — Other Ambulatory Visit: Payer: Self-pay | Admitting: Internal Medicine

## 2014-12-06 NOTE — Telephone Encounter (Signed)
Rx'd year supply 11/2014

## 2014-12-09 ENCOUNTER — Ambulatory Visit (INDEPENDENT_AMBULATORY_CARE_PROVIDER_SITE_OTHER): Payer: Commercial Managed Care - HMO | Admitting: Internal Medicine

## 2014-12-09 ENCOUNTER — Encounter: Payer: Self-pay | Admitting: Internal Medicine

## 2014-12-09 VITALS — BP 150/93 | HR 85 | Temp 97.9°F | Wt 194.5 lb

## 2014-12-09 DIAGNOSIS — Z23 Encounter for immunization: Secondary | ICD-10-CM | POA: Diagnosis not present

## 2014-12-09 DIAGNOSIS — N183 Chronic kidney disease, stage 3 unspecified: Secondary | ICD-10-CM

## 2014-12-09 DIAGNOSIS — I2510 Atherosclerotic heart disease of native coronary artery without angina pectoris: Secondary | ICD-10-CM | POA: Diagnosis not present

## 2014-12-09 DIAGNOSIS — E785 Hyperlipidemia, unspecified: Secondary | ICD-10-CM

## 2014-12-09 DIAGNOSIS — M2560 Stiffness of unspecified joint, not elsewhere classified: Secondary | ICD-10-CM

## 2014-12-09 DIAGNOSIS — I10 Essential (primary) hypertension: Secondary | ICD-10-CM | POA: Diagnosis not present

## 2014-12-09 DIAGNOSIS — J410 Simple chronic bronchitis: Secondary | ICD-10-CM

## 2014-12-09 DIAGNOSIS — E1149 Type 2 diabetes mellitus with other diabetic neurological complication: Secondary | ICD-10-CM

## 2014-12-09 DIAGNOSIS — Z0000 Encounter for general adult medical examination without abnormal findings: Secondary | ICD-10-CM | POA: Diagnosis not present

## 2014-12-09 DIAGNOSIS — M2569 Stiffness of other specified joint, not elsewhere classified: Secondary | ICD-10-CM

## 2014-12-09 LAB — BASIC METABOLIC PANEL WITH GFR
BUN: 12 mg/dL (ref 6–23)
CO2: 30 meq/L (ref 19–32)
Calcium: 9.6 mg/dL (ref 8.4–10.5)
Chloride: 102 mEq/L (ref 96–112)
Creat: 1.42 mg/dL — ABNORMAL HIGH (ref 0.50–1.35)
GFR, Est African American: 59 mL/min — ABNORMAL LOW
GFR, Est Non African American: 51 mL/min — ABNORMAL LOW
GLUCOSE: 120 mg/dL — AB (ref 70–99)
POTASSIUM: 4 meq/L (ref 3.5–5.3)
Sodium: 140 mEq/L (ref 135–145)

## 2014-12-09 LAB — POCT GLYCOSYLATED HEMOGLOBIN (HGB A1C): Hemoglobin A1C: 7.8

## 2014-12-09 LAB — LIPID PANEL
CHOL/HDL RATIO: 3.2 ratio
Cholesterol: 145 mg/dL (ref 0–200)
HDL: 45 mg/dL (ref >39)
LDL Cholesterol: 77 mg/dL (ref 0–99)
Triglycerides: 116 mg/dL (ref <150)
VLDL: 23 mg/dL (ref 0–40)

## 2014-12-09 LAB — GLUCOSE, CAPILLARY: Glucose-Capillary: 144 mg/dL — ABNORMAL HIGH (ref 70–99)

## 2014-12-09 NOTE — Patient Instructions (Signed)
Your sugar has increased a bit. Focus on eating healthy. See me in three months [place]! I will mail you your test results Try the Flexeril at bedtime if your back acts up. Mau cause unsteadiness and fatigue so try at bedtime first.

## 2014-12-10 LAB — MICROALBUMIN / CREATININE URINE RATIO
Creatinine, Urine: 175.1 mg/dL
MICROALB UR: 0.8 mg/dL (ref <2.0)
MICROALB/CREAT RATIO: 4.6 mg/g (ref 0.0–30.0)

## 2014-12-10 MED ORDER — CYCLOBENZAPRINE HCL 5 MG PO TABS: 5.0000 mg | ORAL_TABLET | Freq: Three times a day (TID) | ORAL | Status: DC | PRN

## 2014-12-10 NOTE — Assessment & Plan Note (Signed)
His BP's zig zag from controlled to uncontrolled from appt to appt. Was great last appt. Hesitant to increase md as leaving for [place] today and will be gone one month, Healthy eating and F/U 3 months.   BP Readings from Last 3 Encounters:  12/09/14 150/93  06/03/14 129/84  11/05/13 144/92

## 2014-12-10 NOTE — Assessment & Plan Note (Signed)
On Zocor 40. Check FLP today.

## 2014-12-10 NOTE — Assessment & Plan Note (Addendum)
States every couple of months he gets stiffness in lower back B. This causes him slight trouble standing up and getting moving. Lasts 1-2 days and resolves completely No radiation. Tylenol didn't help. NSAID's CI 2/2 CRI. Was not able to get Voltarin 2/2 insurance. Currently without pain. Likely MS pain. Will look into Voltarin. Rx Flexeril PRN. Explained needs to take at bedtime to see how it affects him and to use caution.

## 2014-12-10 NOTE — Assessment & Plan Note (Signed)
Lab Results  Component Value Date   CREATININE 1.42* 12/09/2014    Will recheck Cr today and get micro alb. Not on ACE (cough) nor ARB (no microalbumin).

## 2014-12-10 NOTE — Progress Notes (Signed)
   Subjective:    Patient ID: Timothy Yu, male    DOB: 1951/03/22, 64 y.o.   MRN: [id number]  HPI  Please see the A&P for the status of the pt's chronic medical problems.  Review of Systems  Constitutional: Negative for unexpected weight change.  Respiratory: Negative for shortness of breath.   Cardiovascular: Negative for chest pain.  Gastrointestinal:       No GERD  Genitourinary:       Taking longer to pee. No other BPH sx  Musculoskeletal:       See A&P  Skin:       Lipoma unchanged. Not itchy  Psychiatric/Behavioral: Negative for sleep disturbance.       Objective:   Physical Exam  Constitutional: He is oriented to person, place, and time. He appears well-developed and well-nourished. No distress.  HENT:  Head: Normocephalic and atraumatic.  Right Ear: External ear normal.  Left Ear: External ear normal.  Nose: Nose normal.  Eyes: Conjunctivae and EOM are normal. Right eye exhibits no discharge. Left eye exhibits no discharge. No scleral icterus.  Cardiovascular: Normal rate, regular rhythm and normal heart sounds.   Pulmonary/Chest: Effort normal and breath sounds normal.  Musculoskeletal: Normal range of motion. He exhibits no edema.  Neurological: He is alert and oriented to person, place, and time.  Skin: Skin is warm. He is not diaphoretic.  Psychiatric: He has a normal mood and affect. His behavior is normal. Judgment and thought content normal.          Assessment & Plan:

## 2014-12-10 NOTE — Assessment & Plan Note (Signed)
Confirms occ tobacco use.

## 2014-12-10 NOTE — Assessment & Plan Note (Addendum)
Asymptomatic. Cont BB, ASA, statin.   DISCUSS STOPPING PLETAL AT NEXT APPT. NO LONGER MED NECESSARY. Checked with Dr Jens Somrenshaw and not necessary from cards standpoint.

## 2014-12-10 NOTE — Assessment & Plan Note (Addendum)
Lab Results  Component Value Date   HGBA1C 7.8 12/09/2014   On glucotrol 5. No CBG at home. A1C up from 7.1 but just had holidays. He knows healthy foods and will focus on halthy eating for 3 months. If cont to trend up, will need to escalate tx next appt. Since he no showed 7 appt with optho he is on his own to arrange optho appt.

## 2014-12-13 ENCOUNTER — Encounter: Payer: Self-pay | Admitting: Internal Medicine

## 2015-01-04 ENCOUNTER — Other Ambulatory Visit: Payer: Self-pay | Admitting: Internal Medicine

## 2015-01-28 ENCOUNTER — Other Ambulatory Visit: Payer: Self-pay | Admitting: Internal Medicine

## 2015-03-16 ENCOUNTER — Encounter: Payer: Self-pay | Admitting: Internal Medicine

## 2015-03-16 DIAGNOSIS — I700 Atherosclerosis of aorta: Secondary | ICD-10-CM | POA: Insufficient documentation

## 2015-03-16 DIAGNOSIS — I708 Atherosclerosis of other arteries: Secondary | ICD-10-CM

## 2015-03-17 ENCOUNTER — Ambulatory Visit: Payer: Commercial Managed Care - HMO | Admitting: Internal Medicine

## 2015-04-05 ENCOUNTER — Ambulatory Visit: Payer: Commercial Managed Care - HMO | Admitting: Cardiology

## 2015-05-31 ENCOUNTER — Telehealth: Payer: Self-pay | Admitting: *Deleted

## 2015-05-31 ENCOUNTER — Encounter: Payer: Self-pay | Admitting: *Deleted

## 2015-05-31 NOTE — Telephone Encounter (Signed)
[facility] Amitriptylene 75 mg tablets # 30 .  Cost will be $4.  Call to patient to ask if he still wants to use [facility] and to notify [facility] of [facility].  Call to [facility] cost will be $20.68.  Attempts to call patient x 2 unable to leave a message.  Call to contact # person there did not know patient.  Letter to be sent to patient to call the Clinics.  Angelina OkGladys Herbin, RN 05/31/2015 4:26 PM

## 2015-06-27 NOTE — Progress Notes (Signed)
HPI: FU CAD.LHC (06/2009): pLAD 30-40%, mLAD 50%, apical LAD 40%, mD1 65%, proximal and mid CFX 30%, OM1 30%, mOM2 50-60%, pRCA 20%, mRCA 95%, dCA 50%, oPL 40%, EF 60%. PCI: BMS to the mRCA. Nuclear study in December 2014 showed ejection fraction 55%, inferior infarct and mild peri-infarct ischemia.  Since last seen,   Current Outpatient Prescriptions  Medication Sig Dispense Refill  . albuterol (PROVENTIL HFA;VENTOLIN HFA) 108 (90 BASE) MCG/ACT inhaler Inhale 2 puffs into the lungs every 6 (six) hours as needed for wheezing or shortness of breath. 1 Inhaler 5  . amitriptyline (ELAVIL) 75 MG tablet Take 1 tablet (75 mg total) by mouth at bedtime. 30 tablet 11  . aspirin 81 MG tablet Take 81 mg by mouth daily.    . cyclobenzaprine (FLEXERIL) 5 MG tablet TAKE 1 TABLET BY MOUTH 3 TIMES A DAY AS NEEDED FOR MUSCLE SPASM 30 tablet 2  . diclofenac sodium (VOLTAREN) 1 % GEL Apply 2 g topically 4 (four) times daily. To Left elbow. (Patient not taking: Reported on 12/10/2014) 300 g 3  . glipiZIDE (GLUCOTROL) 5 MG tablet TAKE 1 TABLET BY MOUTH TWICE DAILY 180 tablet 3  . hydrochlorothiazide (MICROZIDE) 12.5 MG capsule TAKE 1 CAPSULE BY MOUTH EVERY DAY 90 capsule 3  . metoprolol succinate (TOPROL-XL) 50 MG 24 hr tablet TAKE 1 TABLET BY MOUTH EVERY DAY 90 tablet 3  . Multiple Vitamin (MULTIVITAMIN) tablet Take 1 tablet by mouth daily. 90 tablet 3  . nitroGLYCERIN (NITROSTAT) 0.4 MG SL tablet Place 1 tablet (0.4 mg total) under the tongue every 5 (five) minutes as needed for chest pain. 30 tablet 11  . pantoprazole (PROTONIX) 40 MG tablet TAKE 1 TABLET BY MOUTH EVERY DAY 90 tablet 3  . simvastatin (ZOCOR) 40 MG tablet TAKE 1 TABLET BY MOUTH EVERY NIGHT AT BEDTIME 90 tablet 3  . tamsulosin (FLOMAX) 0.4 MG CAPS capsule TAKE 1 CAPSULE BY MOUTH EVERY DAY 90 capsule 3   No current facility-administered medications for this visit.     Past Medical History  Diagnosis Date  . Hyperlipidemia     Well  controlled on statin  . Hypertension     Well controlled on HCTZ, ARB, and BB  . DM (diabetes mellitus) type II controlled, neurological manifestation     diagnosis 2007, oral tx only. Has peripheral neuropathy  . Coronary artery disease 06/2009    Cath 06/2009 : bare metal stent to mid RCA 2/2 severe occlusion. Mod non-obstructing dz LAD & circ. On BB, statin, and ASA.;  ETT-Myoview (12/14):  Low risk, inf scar with mild peri-infarct ischemia; EF 55%  . CKD (chronic kidney disease) stage 3, GFR 30-59 ml/min 2013    On ARB  . GERD (gastroesophageal reflux disease)     On PPI  . BPH (benign prostatic hyperplasia)     On Flomax  . Insomnia     Trial of elavil 2013  . Neuropathic pain of both legs     Started 2012 when he got steroid shot for L lumbar pain and started as needle was withdrawn. Intolerant of Gabapentin. On Elavil    No past surgical history on file.  History   Social History  . Marital Status: Divorced    Spouse Name: N/A  . Number of Children: N/A  . Years of Education: 12   Occupational History  . unemployed    Social History Main Topics  . Smoking status: Current Some Day Smoker -- 0.10  packs/day    Types: Cigarettes  . Smokeless tobacco: Not on file     Comment: Pt trying to slowly decrease .  1 cig per day  . Alcohol Use: Yes  . Drug Use: No  . Sexual Activity: Not on file     Comment: disabled, single, from [place]   Other Topics Concern  . Not on file   Social History Narrative   Patient gets regular exercise.   Caffeine use: occasionally    ROS: no fevers or chills, productive cough, hemoptysis, dysphasia, odynophagia, melena, hematochezia, dysuria, hematuria, rash, seizure activity, orthopnea, PND, pedal edema, claudication. Remaining systems are negative.  Physical Exam: Well-developed well-nourished in no acute distress.  Skin is warm and dry.  HEENT is normal.  Neck is supple.  Chest is clear to auscultation with normal expansion.    Cardiovascular exam is regular rate and rhythm.  Abdominal exam nontender or distended. No masses palpated. Extremities show no edema. neuro grossly intact  ECG     This encounter was created in error - please disregard.

## 2015-06-28 ENCOUNTER — Encounter: Payer: Commercial Managed Care - HMO | Admitting: Cardiology

## 2015-06-29 ENCOUNTER — Encounter: Payer: Self-pay | Admitting: Cardiology

## 2015-07-07 ENCOUNTER — Encounter: Payer: Commercial Managed Care - HMO | Admitting: Internal Medicine

## 2015-07-21 ENCOUNTER — Encounter: Payer: Self-pay | Admitting: Cardiology

## 2015-11-01 ENCOUNTER — Encounter: Payer: Self-pay | Admitting: Internal Medicine

## 2015-12-16 ENCOUNTER — Other Ambulatory Visit: Payer: Self-pay | Admitting: Internal Medicine

## 2015-12-20 NOTE — Telephone Encounter (Signed)
Pt last seen Jan last year- does have appointment scheduled for Feb

## 2016-01-12 ENCOUNTER — Ambulatory Visit: Payer: Commercial Managed Care - HMO | Admitting: Internal Medicine

## 2016-01-12 ENCOUNTER — Encounter: Payer: Self-pay | Admitting: Internal Medicine

## 2016-03-21 ENCOUNTER — Other Ambulatory Visit: Payer: Self-pay | Admitting: Internal Medicine

## 2016-03-21 NOTE — Telephone Encounter (Signed)
Last appt 12/10/15; no f/u appt scheduled.

## 2016-03-21 NOTE — Telephone Encounter (Signed)
Hasn't been seen in > 1 yr. Needs appt

## 2016-03-22 NOTE — Telephone Encounter (Signed)
Called patient this am with telephone number on file.  No answer, but I was able to leave a voicemail message asking him to return my phone asap.

## 2016-03-22 NOTE — Telephone Encounter (Signed)
Message sent to front office to schedule pt an appt. 

## 2016-04-13 ENCOUNTER — Other Ambulatory Visit: Payer: Self-pay | Admitting: Internal Medicine

## 2016-04-16 NOTE — Telephone Encounter (Signed)
Last appointment 12/09/2014. Will you call patient for appointment

## 2016-04-16 NOTE — Telephone Encounter (Signed)
Not seen since Jan 2016

## 2016-04-18 ENCOUNTER — Telehealth: Payer: Self-pay | Admitting: Internal Medicine

## 2016-04-18 ENCOUNTER — Encounter: Payer: Self-pay | Admitting: Internal Medicine

## 2016-04-18 NOTE — Telephone Encounter (Signed)
Attempted to contact patient this am, but no answer.  Was able to leave a voicemail message asking to please give me a call back asap.  Going to send patient a letter asking him to call the clinic to set up an appt.

## 2016-05-17 ENCOUNTER — Other Ambulatory Visit: Payer: Self-pay | Admitting: Internal Medicine

## 2016-05-17 NOTE — Telephone Encounter (Signed)
Last appt 12/09/14- no f/u appt. Called telephone# [phone] provided from [facility] - different person name on voicemail.

## 2016-05-17 NOTE — Telephone Encounter (Signed)
Needs appt

## 2016-07-06 ENCOUNTER — Other Ambulatory Visit: Payer: Self-pay | Admitting: Internal Medicine

## 2016-08-21 ENCOUNTER — Encounter: Payer: Self-pay | Admitting: Internal Medicine

## 2016-11-27 ENCOUNTER — Other Ambulatory Visit: Payer: Self-pay | Admitting: Internal Medicine

## 2019-03-05 DIAGNOSIS — I10 Essential (primary) hypertension: Secondary | ICD-10-CM | POA: Diagnosis not present

## 2019-03-05 DIAGNOSIS — H8111 Benign paroxysmal vertigo, right ear: Secondary | ICD-10-CM | POA: Diagnosis not present

## 2019-03-05 DIAGNOSIS — R42 Dizziness and giddiness: Secondary | ICD-10-CM | POA: Diagnosis not present

## 2019-03-05 DIAGNOSIS — H8110 Benign paroxysmal vertigo, unspecified ear: Secondary | ICD-10-CM | POA: Diagnosis not present

## 2022-08-14 ENCOUNTER — Encounter: Payer: Self-pay | Admitting: Internal Medicine

## 2022-08-14 ENCOUNTER — Ambulatory Visit: Payer: Medicare Other | Attending: Internal Medicine | Admitting: Internal Medicine

## 2022-08-14 VITALS — BP 132/62 | HR 82 | Ht 74.0 in | Wt 168.8 lb

## 2022-08-14 DIAGNOSIS — Z79899 Other long term (current) drug therapy: Secondary | ICD-10-CM

## 2022-08-14 DIAGNOSIS — I10 Essential (primary) hypertension: Secondary | ICD-10-CM | POA: Diagnosis not present

## 2022-08-14 MED ORDER — HYDROCHLOROTHIAZIDE 25 MG PO TABS: 25.0000 mg | ORAL_TABLET | Freq: Every day | ORAL | 5 refills | Status: AC

## 2022-08-14 NOTE — Progress Notes (Unsigned)
Cardiology Office Note:    Date:  08/15/2022   ID:  Timothy Yu, DOB 19-Feb-1951, MRN [id number]  PCP:  No primary care provider on file.   [facility] Providers Cardiologist:  Maisie Fus, MD     Referring MD: Judee Clara, FNP   No chief complaint on file. Ischemic Heart Disease/CVA  History of Present Illness:    Timothy Yu is a 71 y.o. male with a hx of BMS mid-RCA for 95% lesion 06/24/2009, myoview 2014 noted to have inferior scar with mild per-infarct ischemia, normal EF. Last saw Dr. Jens Som 2012. CKD stage 3.  Noted to have had a CVA as being the referral reason. He states he had a stroke in [place] in April. I don't have any records. He said he had an echo and it was normal. He was on DAPT. Blood pressures are in the 140s He walks everyday. He gets shortness of breath sometimes. No chest pressure  Past Medical History:  Diagnosis Date   BPH (benign prostatic hyperplasia)    On Flomax   CKD (chronic kidney disease) stage 3, GFR 30-59 ml/min (HCC) 2013   On ARB   Coronary artery disease 06/2009   Cath 06/2009 : bare metal stent to mid RCA 2/2 severe occlusion. Mod non-obstructing dz LAD & circ. On BB, statin, and ASA.;  ETT-Myoview (12/14):  Low risk, inf scar with mild peri-infarct ischemia; EF 55%   DM (diabetes mellitus) type II controlled, neurological manifestation (HCC)    diagnosis 2007, oral tx only. Has peripheral neuropathy   GERD (gastroesophageal reflux disease)    On PPI   Hyperlipidemia    Well controlled on statin   Hypertension    Well controlled on HCTZ, ARB, and BB   Insomnia    Trial of elavil 2013   Neuropathic pain of both legs    Started 2012 when he got steroid shot for L lumbar pain and started as needle was withdrawn. Intolerant of Gabapentin. On Elavil    History reviewed. No pertinent surgical history.  Current Medications: Current Meds  Medication Sig   albuterol (PROVENTIL HFA;VENTOLIN HFA) 108 (90 BASE)  MCG/ACT inhaler Inhale 2 puffs into the lungs every 6 (six) hours as needed for wheezing or shortness of breath.   amitriptyline (ELAVIL) 75 MG tablet Take 1 tablet (75 mg total) by mouth at bedtime.   amLODipine (NORVASC) 10 MG tablet Take 10 mg by mouth daily.   aspirin 81 MG tablet Take 81 mg by mouth daily.   cyclobenzaprine (FLEXERIL) 5 MG tablet TAKE 1 TABLET BY MOUTH 3 TIMES A DAY AS NEEDED FOR MUSCLE SPASM   diclofenac sodium (VOLTAREN) 1 % GEL Apply 2 g topically 4 (four) times daily. To Left elbow.   glipiZIDE (GLUCOTROL) 5 MG tablet TAKE 1 TABLET BY MOUTH TWICE DAILY   hydrochlorothiazide (HYDRODIURIL) 25 MG tablet Take 1 tablet (25 mg total) by mouth daily.   metoprolol succinate (TOPROL-XL) 50 MG 24 hr tablet TAKE 1 TABLET BY MOUTH EVERY DAY   Multiple Vitamin (MULTIVITAMIN) tablet Take 1 tablet by mouth daily.   nitroGLYCERIN (NITROSTAT) 0.4 MG SL tablet Place 1 tablet (0.4 mg total) under the tongue every 5 (five) minutes as needed for chest pain.   pantoprazole (PROTONIX) 40 MG tablet TAKE 1 TABLET BY MOUTH EVERY DAY   simvastatin (ZOCOR) 40 MG tablet TAKE 1 TABLET BY MOUTH EVERY NIGHT AT BEDTIME   tamsulosin (FLOMAX) 0.4 MG CAPS capsule TAKE 1 CAPSULE  BY MOUTH EVERY DAY   [DISCONTINUED] clopidogrel (PLAVIX) 75 MG tablet Take 75 mg by mouth daily.   [DISCONTINUED] hydrochlorothiazide (MICROZIDE) 12.5 MG capsule TAKE 1 CAPSULE BY MOUTH EVERY DAY     Allergies:   Lisinopril, Gabapentin, and Metformin and related   Social History   Socioeconomic History   Marital status: Divorced    Spouse name: Not on file   Number of children: Not on file   Years of education: 12   Highest education level: Not on file  Occupational History   Occupation: unemployed  Tobacco Use   Smoking status: Some Days    Packs/day: 0.10    Types: Cigarettes   Smokeless tobacco: Not on file   Tobacco comments:    Pt trying to slowly decrease .  1 cig per day  Substance and Sexual Activity    Alcohol use: Yes   Drug use: No   Sexual activity: Not on file    Comment: disabled, single, from [place]  Other Topics Concern   Not on file  Social History Narrative   Patient gets regular exercise.   Caffeine use: occasionally   Social Determinants of Health   Financial Resource Strain: Not on file  Food Insecurity: Not on file  Transportation Needs: Not on file  Physical Activity: Not on file  Stress: Not on file  Social Connections: Not on file     Family History: The patient's family history includes Diabetes in his mother; Heart attack (age of onset: 44) in his sister; Ulcers in his father. There is no history of Colon cancer.  ROS:   Please see the history of present illness.     All other systems reviewed and are negative.  EKGs/Labs/Other Studies Reviewed:    The following studies were reviewed today:   EKG:  EKG is  ordered today.  The ekg ordered today demonstrates   08/14/2022- NSR, LVH  Recent Labs: No results found for requested labs within last 365 days.  Recent Lipid Panel    Component Value Date/Time   CHOL 145 12/09/2014 1121   TRIG 116 12/09/2014 1121   HDL 45 12/09/2014 1121   CHOLHDL 3.2 12/09/2014 1121   VLDL 23 12/09/2014 1121   LDLCALC 77 12/09/2014 1121     Risk Assessment/Calculations:     Physical Exam:    VS:   Vitals:   08/14/22 1545  BP: 132/62  Pulse: 82  SpO2: 97%     Wt Readings from Last 3 Encounters:  08/14/22 168 lb 12.8 oz (76.6 kg)  12/09/14 194 lb 8 oz (88.2 kg)  06/03/14 194 lb 11.2 oz (88.3 kg)     GEN:  Well nourished, well developed in no acute distress HEENT: Normal NECK: No JVD; No carotid bruits LYMPHATICS: No lymphadenopathy CARDIAC: RRR, no murmurs, rubs, gallops RESPIRATORY:  Clear to auscultation without rales, wheezing or rhonchi  ABDOMEN: Soft, non-tender, non-distended MUSCULOSKELETAL:  No edema; No deformity  SKIN: Warm and dry NEUROLOGIC:  Alert and oriented x 3 PSYCHIATRIC:  Normal  affect   ASSESSMENT:    Ischemic Heart Dx: asymptomatic.  -cont BB -continue asa 81 mg daily -continue simvastatin 40 mg daily  CVA: stop plavix. Continue asa. Continue statin. Noted to be related to elevated BP. I don't have the records of his w/u or diagnosis.  HTN: BP not quite at goal and CVA noted in the setting of HTN.  Hctz increase to 25. Continue bb. Continue  norvasc 10 mg daily  Smoking  Cessation: smoking one cigarette per day   PLAN:    In order of problems listed above:  HCTz 25 mg daily Stop plavix Fasting lipid profile  Follow up 6 months      Medication Adjustments/Labs and Tests Ordered: Current medicines are reviewed at length with the patient today.  Concerns regarding medicines are outlined above.  Orders Placed This Encounter  Procedures   Lipid panel   EKG 12-Lead   Meds ordered this encounter  Medications   hydrochlorothiazide (HYDRODIURIL) 25 MG tablet    Sig: Take 1 tablet (25 mg total) by mouth daily.    Dispense:  30 tablet    Refill:  5    Patient Instructions  Medication Instructions:   INCREASE HYDROCHLOROTHIAZIDE TO 25mg  ONCE DAILY   STOP: PLAVIX   *If you need a refill on your cardiac medications before your next appointment, please call your pharmacy*  Lab Work:  Please return for FASTING Blood Work AT NEXT AVAILABLE No appointment needed, lab here at the office is open Monday-Friday from 8AM to 4PM and closed daily for lunch from 12:45-1:45.   If you have labs (blood work) drawn today and your tests are completely normal, you will receive your results only by: MyChart Message (if you have MyChart) OR A paper copy in the mail If you have any lab test that is abnormal or we need to change your treatment, we will call you to review the results.  Testing/Procedures: None Ordered At This Time.   Follow-Up: At [facility], you and your health needs are our priority.  As part of our continuing mission to provide you  with exceptional heart care, we have created designated Provider Care Teams.  These Care Teams include your primary Cardiologist (physician) and Advanced Practice Providers (APPs -  Physician Assistants and Nurse Practitioners) who all work together to provide you with the care you need, when you need it.  Your next appointment:   6 month(s)  The format for your next appointment:   In Person  Provider:   [facility], MD             Signed, Maisie Fus, MD  08/15/2022 7:32 AM    [facility]

## 2022-08-14 NOTE — Patient Instructions (Addendum)
Medication Instructions:   INCREASE HYDROCHLOROTHIAZIDE TO 25mg  ONCE DAILY   STOP: PLAVIX   *If you need a refill on your cardiac medications before your next appointment, please call your pharmacy*  Lab Work:  Please return for FASTING Blood Work AT NEXT AVAILABLE No appointment needed, lab here at the office is open Monday-Friday from 8AM to 4PM and closed daily for lunch from 12:45-1:45.   If you have labs (blood work) drawn today and your tests are completely normal, you will receive your results only by: MyChart Message (if you have MyChart) OR A paper copy in the mail If you have any lab test that is abnormal or we need to change your treatment, we will call you to review the results.  Testing/Procedures: None Ordered At This Time.   Follow-Up: At [facility], you and your health needs are our priority.  As part of our continuing mission to provide you with exceptional heart care, we have created designated Provider Care Teams.  These Care Teams include your primary Cardiologist (physician) and Advanced Practice Providers (APPs -  Physician Assistants and Nurse Practitioners) who all work together to provide you with the care you need, when you need it.  Your next appointment:   6 month(s)  The format for your next appointment:   In Person  Provider:   [facility], MD

## 2022-09-04 ENCOUNTER — Encounter: Payer: Self-pay | Admitting: Gastroenterology

## 2022-11-09 ENCOUNTER — Ambulatory Visit: Payer: Medicare Other | Admitting: Gastroenterology

## 2022-12-18 ENCOUNTER — Ambulatory Visit: Payer: Medicare Other | Admitting: Internal Medicine

## 2022-12-18 ENCOUNTER — Encounter: Payer: Self-pay | Admitting: Internal Medicine

## 2022-12-18 VITALS — BP 136/68 | HR 68 | Ht 74.0 in | Wt 179.0 lb

## 2022-12-18 DIAGNOSIS — R1310 Dysphagia, unspecified: Secondary | ICD-10-CM

## 2022-12-18 NOTE — Progress Notes (Signed)
HISTORY OF PRESENT ILLNESS:  Timothy Yu is a 72 y.o. male with multiple medical problems as listed below.  He is sent today regarding problems with dysphagia.  Patient had been living in [place] in recent years, but recently moved back to [place].  Tells me that he suffered a stroke, while living in [place], April 2023.  Since that time he reports swallowing.  He describes coughing or choking after drinking liquids.  He is also noticed that his voice has been somewhat hoarse.  He does describe what sounds like a barium swallow that demonstrated aspiration.  They recommended speech pathology, but no such evaluation occurred.  He is now living here.  He denies weight loss.  Does have a cardiac history and was evaluated by cardiology August 15, 2022.  Reviewed.  Normal EF.  No longer on Plavix.  Is on aspirin.  Did have colonoscopy elsewhere in 2013.  No prior upper endoscopy that he is aware of...  REVIEW OF SYSTEMS:  All non-GI ROS negative unless otherwise stated in the HPI. Past Medical History:  Diagnosis Date   BPH (benign prostatic hyperplasia)    On Flomax   CKD (chronic kidney disease) stage 3, GFR 30-59 ml/min (HCC) 2013   On ARB   Coronary artery disease 06/2009   Cath 06/2009 : bare metal stent to mid RCA 2/2 severe occlusion. Mod non-obstructing dz LAD & circ. On BB, statin, and ASA.;  ETT-Myoview (12/14):  Low risk, inf scar with mild peri-infarct ischemia; EF 55%   DM (diabetes mellitus) type II controlled, neurological manifestation ([place])    diagnosis 2007, oral tx only. Has peripheral neuropathy   GERD (gastroesophageal reflux disease)    On PPI   Hyperlipidemia    Well controlled on statin   Hypertension    Well controlled on HCTZ, ARB, and BB   Insomnia    Trial of elavil 2013   Neuropathic pain of both legs    Started 2012 when he got steroid shot for L lumbar pain and started as needle was withdrawn. Intolerant of Gabapentin. On Elavil    No past surgical  history on file.  Social History Devaris Quirk [facility]  reports that he has been smoking cigarettes. He has been smoking an average of .1 packs per day. He does not have any smokeless tobacco history on file. He reports current alcohol use. He reports that he does not use drugs.  family history includes Diabetes in his mother; Heart attack (age of onset: 13) in his sister; Ulcers in his father.  Allergies  Allergen Reactions   Lisinopril Cough   Gabapentin Other (See Comments)    Mouth numb and caused him to feel bad   Metformin And Related Diarrhea    See D Raisch's note 05/2012       PHYSICAL EXAMINATION: Vital signs: BP 136/68 (BP Location: Left Arm, Patient Position: Sitting, Cuff Size: Normal)   Pulse 68   Ht 6\' 2"  (1.88 m)   Wt 179 lb (81.2 kg)   SpO2 96%   BMI 22.98 kg/m   Constitutional: Thin, somewhat frail-appearing, no acute distress Psychiatric: alert and oriented x3, cooperative.  Slightly hoarse voice Eyes: extraocular movements intact, anicteric, conjunctiva pink Mouth: oral pharynx moist, no lesions.  Poor dentition Neck: supple no lymphadenopathy Cardiovascular: heart regular rate and rhythm, no murmur Lungs: clear to auscultation bilaterally Abdomen: soft, nontender, nondistended, no obvious ascites, no peritoneal signs, normal bowel sounds, no organomegaly Rectal: Omitted Extremities: no clubbing, cyanosis,  or lower extremity edema bilaterally Skin: no lesions on visible extremities Neuro: No focal deficits.  Cranial nerves intact  ASSESSMENT:  1.  Dysphagia to liquids with coughing.  He describes prior barium esophagram with aspiration.  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 2.  Multiple medical problems.  Stable   PLAN:  1.  Upper endoscopy to rule out any other significant recurrent pathology.  Patient is HIGH RISK given his age and comorbidities.  Monitored anesthesia care will be provided for his procedure.The nature of the procedure, as well as  the risks, benefits, and alternatives were carefully and thoroughly reviewed with the patient. Ample time for discussion and questions allowed. The patient understood, was satisfied, and agreed to proceed. 2.  If negative, then referral to speech pathology for modified barium swallow and subsequent treatment of neurogenic oropharyngeal dysphagia.

## 2022-12-18 NOTE — Patient Instructions (Signed)
_______________________________________________________  If your blood pressure at your visit was 140/90 or greater, please contact your primary care physician to follow up on this.  _______________________________________________________  If you are age 74 or older, your body mass index should be between 23-30. Your Body mass index is 22.98 kg/m. If this is out of the aforementioned range listed, please consider follow up with your Primary Care Provider.  If you are age 47 or younger, your body mass index should be between 19-25. Your Body mass index is 22.98 kg/m. If this is out of the aformentioned range listed, please consider follow up with your Primary Care Provider.   ________________________________________________________  The [facility] providers would like to encourage you to use [facility] to communicate with providers for non-urgent requests or questions.  Due to long hold times on the telephone, sending your provider a message by [facility] may be a faster and more efficient way to get a response.  Please allow 48 business hours for a response.  Please remember that this is for non-urgent requests.  _______________________________________________________  Timothy Yu have been scheduled for an endoscopy. Please follow written instructions given to you at your visit today. If you use inhalers (even only as needed), please bring them with you on the day of your procedure.

## 2022-12-21 ENCOUNTER — Ambulatory Visit (AMBULATORY_SURGERY_CENTER): Payer: Medicare Other | Admitting: Internal Medicine

## 2022-12-21 ENCOUNTER — Encounter: Payer: Self-pay | Admitting: Internal Medicine

## 2022-12-21 VITALS — BP 108/68 | HR 50 | Temp 98.2°F | Resp 16 | Ht 74.0 in | Wt 179.0 lb

## 2022-12-21 DIAGNOSIS — R1310 Dysphagia, unspecified: Secondary | ICD-10-CM | POA: Diagnosis not present

## 2022-12-21 HISTORY — PX: UPPER GASTROINTESTINAL ENDOSCOPY: SHX188

## 2022-12-21 MED ORDER — SODIUM CHLORIDE 0.9 % IV SOLN: 500.0000 mL | Freq: Once | INTRAVENOUS | Status: AC

## 2022-12-21 NOTE — Progress Notes (Signed)
Pt's states no medical or surgical changes since previsit or office visit. 

## 2022-12-21 NOTE — Op Note (Signed)
[facility] Patient Name: Timothy Yu Procedure Date: 12/21/2022 10:36 AM MRN: [id number] Endoscopist: Docia Chuck. 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 The patient's tolerance of                            previous anesthesia was also reviewed. The risks                            and benefits of the procedure and the sedation                            options and risks were discussed with the patient.                            All questions were answered, and informed consent                            was obtained. Prior Anticoagulants: The patient has                            taken no anticoagulant or antiplatelet agents. ASA                            Grade Assessment: II - A patient with mild systemic                            disease. After reviewing the risks and benefits,                            the patient was deemed in satisfactory condition to                            undergo the procedure.                           After obtaining informed consent, the endoscope was  passed under direct vision. Throughout the                            procedure, the patient's blood pressure, pulse, and                            oxygen saturations were monitored continuously. The                            GIF [id number] #[id number] was introduced through the                            mouth, and advanced to the second part of duodenum.                            The upper GI endoscopy was  accomplished without                            difficulty. The patient tolerated the procedure                            well. Scope In: Scope Out: Findings:                 The esophagus was normal.                           The stomach was normal.                           The examined duodenum was normal.                           The cardia and gastric fundus were normal on                            retroflexion. Complications:            No immediate complications. Estimated Blood Loss:     Estimated blood loss: none. Impression:               - Normal esophagus.                           - Normal stomach.                           - Normal examined duodenum.                           - No specimens collected. Recommendation:           - Patient has a contact number available for                            emergencies. The signs and symptoms of potential  delayed complications were discussed with the                            patient. Return to normal activities tomorrow.                            Written discharge instructions were provided to the                            patient.                           - Resume previous diet.                           - Continue present medications.                           - PLEASE SCHEDULE MODIFIED BARIUM SWALLOW WITH                            SPEECH PATHOLOGY "neurogenic dysphagia, evaluate                            and treat" [name] N. Marina Goodell, MD 12/21/2022 10:49:11 AM This report has been signed electronically.

## 2022-12-21 NOTE — Progress Notes (Signed)
Sedate, gd SR, tolerated procedure well, VSS, report to RN 

## 2022-12-21 NOTE — Patient Instructions (Signed)
Thank you for coming In to see [place] today. Resume your normal diet and regular medications today. Return to your normal daily activities tomorrow  Today's examine was normal.  Therefore we are going to have you scheduled to have a modified barium swallow that will allow [place] to better understand your swallowing issues. We will call to make this appointment with you.     YOU HAD AN ENDOSCOPIC PROCEDURE TODAY AT [facility]:   Refer to the procedure report that was given to you for any specific questions about what was found during the examination.  If the procedure report does not answer your questions, please call your gastroenterologist to clarify.  If you requested that your care partner not be given the details of your procedure findings, then the procedure report has been included in a sealed envelope for you to review at your convenience later.  YOU SHOULD EXPECT: Some feelings of bloating in the abdomen. Passage of more gas than usual.  Walking can help get rid of the air that was put into your GI tract during the procedure and reduce the bloating. If you had a lower endoscopy (such as a colonoscopy or flexible sigmoidoscopy) you may notice spotting of blood in your stool or on the toilet paper. If you underwent a bowel prep for your procedure, you may not have a normal bowel movement for a few days.  Please Note:  You might notice some irritation and congestion in your nose or some drainage.  This is from the oxygen used during your procedure.  There is no need for concern and it should clear up in a day or so.  SYMPTOMS TO REPORT IMMEDIATELY:    Following upper endoscopy (EGD)  Vomiting of blood or coffee ground material  New chest pain or pain under the shoulder blades  Painful or persistently difficult swallowing  New shortness of breath  Fever of 100F or higher  Black, tarry-looking stools  For urgent or emergent issues, a gastroenterologist can be reached at any  hour by calling [phone]. Do not use MyChart messaging for urgent concerns.    DIET:  We do recommend a small meal at first, but then you may proceed to your regular diet.  Drink plenty of fluids but you should avoid alcoholic beverages for 24 hours.  ACTIVITY:  You should plan to take it easy for the rest of today and you should NOT DRIVE or use heavy machinery until tomorrow (because of the sedation medicines used during the test).    FOLLOW UP: Our staff will call the number listed on your records the next business day following your procedure.  We will call around 7:15- 8:00 am to check on you and address any questions or concerns that you may have regarding the information given to you following your procedure. If we do not reach you, we will leave a message.     If any biopsies were taken you will be contacted by phone or by letter within the next 1-3 weeks.  Please call us at [phone] if you have not heard about the biopsies in 3 weeks.    SIGNATURES/CONFIDENTIALITY: You and/or your care partner have signed paperwork which will be entered into your electronic medical record.  These signatures attest to the fact that that the information above on your After Visit Summary has been reviewed and is understood.  Full responsibility of the confidentiality of this discharge information lies with you and/or your care-partner.

## 2022-12-21 NOTE — Progress Notes (Signed)
HISTORY OF PRESENT ILLNESS:  Timothy Yu is a 71 y.o. male with multiple medical problems as listed below.  He is sent today regarding problems with dysphagia.  Patient had been living in [place] in recent years, but recently moved back to [place].  Tells me that he suffered a stroke, while living in [place], April 2023.  Since that time he reports swallowing.  He describes coughing or choking after drinking liquids.  He is also noticed that his voice has been somewhat hoarse.  He does describe what sounds like a barium swallow that demonstrated aspiration.  They recommended speech pathology, but no such evaluation occurred.  He is now living here.  He denies weight loss.  Does have a cardiac history and was evaluated by cardiology August 15, 2022.  Reviewed.  Normal EF.  No longer on Plavix.  Is on aspirin.  Did have colonoscopy elsewhere in 2013.  No prior upper endoscopy that he is aware of...  REVIEW OF SYSTEMS:  All non-GI ROS negative unless otherwise stated in the HPI. Past Medical History:  Diagnosis Date   BPH (benign prostatic hyperplasia)    On Flomax   CKD (chronic kidney disease) stage 3, GFR 30-59 ml/min (HCC) 2013   On ARB   Coronary artery disease 06/2009   Cath 06/2009 : bare metal stent to mid RCA 2/2 severe occlusion. Mod non-obstructing dz LAD & circ. On BB, statin, and ASA.;  ETT-Myoview (12/14):  Low risk, inf scar with mild peri-infarct ischemia; EF 55%   DM (diabetes mellitus) type II controlled, neurological manifestation (HCC)    diagnosis 2007, oral tx only. Has peripheral neuropathy   GERD (gastroesophageal reflux disease)    On PPI   Hyperlipidemia    Well controlled on statin   Hypertension    Well controlled on HCTZ, ARB, and BB   Insomnia    Trial of elavil 2013   Neuropathic pain of both legs    Started 2012 when he got steroid shot for L lumbar pain and started as needle was withdrawn. Intolerant of Gabapentin. On Elavil    No past surgical  history on file.  Social History Timothy Yu  reports that he has been smoking cigarettes. He has been smoking an average of .1 packs per day. He does not have any smokeless tobacco history on file. He reports current alcohol use. He reports that he does not use drugs.  family history includes Diabetes in his mother; Heart attack (age of onset: 48) in his sister; Ulcers in his father.  Allergies  Allergen Reactions   Lisinopril Cough   Gabapentin Other (See Comments)    Mouth numb and caused him to feel bad   Metformin And Related Diarrhea    See D Raisch's note 05/2012       PHYSICAL EXAMINATION: Vital signs: BP 136/68 (BP Location: Left Arm, Patient Position: Sitting, Cuff Size: Normal)   Pulse 68   Ht 6' 2" (1.88 m)   Wt 179 lb (81.2 kg)   SpO2 96%   BMI 22.98 kg/m   Constitutional: Thin, somewhat frail-appearing, no acute distress Psychiatric: alert and oriented x3, cooperative.  Slightly hoarse voice Eyes: extraocular movements intact, anicteric, conjunctiva pink Mouth: oral pharynx moist, no lesions.  Poor dentition Neck: supple no lymphadenopathy Cardiovascular: heart regular rate and rhythm, no murmur Lungs: clear to auscultation bilaterally Abdomen: soft, nontender, nondistended, no obvious ascites, no peritoneal signs, normal bowel sounds, no organomegaly Rectal: Omitted Extremities: no clubbing, cyanosis,   or lower extremity edema bilaterally Skin: no lesions on visible extremities Neuro: No focal deficits.  Cranial nerves intact  ASSESSMENT:  1.  Dysphagia to liquids with coughing.  He describes prior barium esophagram with aspiration.  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 2.  Multiple medical problems.  Stable   PLAN:  1.  Upper endoscopy to rule out any other significant recurrent pathology.  Patient is HIGH RISK given his age and comorbidities.  Monitored anesthesia care will be provided for his procedure.The nature of the procedure, as well as  the risks, benefits, and alternatives were carefully and thoroughly reviewed with the patient. Ample time for discussion and questions allowed. The patient understood, was satisfied, and agreed to proceed. 2.  If negative, then referral to speech pathology for modified barium swallow and subsequent treatment of neurogenic oropharyngeal dysphagia.      

## 2022-12-24 ENCOUNTER — Telehealth: Payer: Self-pay

## 2022-12-24 ENCOUNTER — Other Ambulatory Visit: Payer: Self-pay

## 2022-12-24 DIAGNOSIS — R1310 Dysphagia, unspecified: Secondary | ICD-10-CM

## 2022-12-24 NOTE — Telephone Encounter (Signed)
  Follow up Call-     12/21/2022   10:08 AM  Call back number  Post procedure Call Back phone  # [phone]  Permission to leave phone message Yes     Patient questions:  Do you have a fever, pain , or abdominal swelling? No. Pain Score  0 *  Have you tolerated food without any problems? Yes.    Have you been able to return to your normal activities? Yes.    Do you have any questions about your discharge instructions: Diet   No. Medications  No. Follow up visit  No.  Do you have questions or concerns about your Care? No.  Actions: * If pain score is 4 or above: No action needed, pain <4.

## 2022-12-24 NOTE — Telephone Encounter (Signed)
Order entered in epic for SLP MBS eval and treat for neurogenic dysphagia/eval and treat. Rehab should contact pt regarding scheduling appt.

## 2022-12-25 ENCOUNTER — Other Ambulatory Visit (HOSPITAL_COMMUNITY): Payer: Self-pay

## 2022-12-25 DIAGNOSIS — R1310 Dysphagia, unspecified: Secondary | ICD-10-CM

## 2023-01-07 ENCOUNTER — Ambulatory Visit (HOSPITAL_COMMUNITY)
Admission: RE | Admit: 2023-01-07 | Discharge: 2023-01-07 | Disposition: A | Discharge status: Home / Self Care | Payer: Medicare Other | Source: Ambulatory Visit

## 2023-01-07 ENCOUNTER — Ambulatory Visit (HOSPITAL_COMMUNITY): Payer: Medicare Other

## 2023-01-07 DIAGNOSIS — R1310 Dysphagia, unspecified: Secondary | ICD-10-CM

## 2023-01-15 ENCOUNTER — Other Ambulatory Visit: Payer: Self-pay

## 2023-01-15 DIAGNOSIS — R1310 Dysphagia, unspecified: Secondary | ICD-10-CM

## 2023-01-23 ENCOUNTER — Ambulatory Visit (HOSPITAL_COMMUNITY): Payer: Medicare Other

## 2023-01-23 ENCOUNTER — Ambulatory Visit (HOSPITAL_COMMUNITY)
Admission: RE | Admit: 2023-01-23 | Discharge: 2023-01-23 | Disposition: A | Discharge status: Home / Self Care | Payer: Medicare Other | Source: Ambulatory Visit

## 2023-01-23 DIAGNOSIS — R1310 Dysphagia, unspecified: Secondary | ICD-10-CM

## 2023-02-06 ENCOUNTER — Encounter (HOSPITAL_COMMUNITY): Payer: Medicare Other

## 2023-02-12 ENCOUNTER — Ambulatory Visit: Payer: Medicare Other | Attending: Internal Medicine | Admitting: Internal Medicine

## 2023-02-12 NOTE — Progress Notes (Deleted)
Cardiology Office Note:    Date:  02/12/2023   ID:  Timothy Yu, DOB 08/09/1951, MRN [id number]  PCP:  Patient, No Pcp Per   [facility] Providers Cardiologist:  Janina Mayo, MD     Referring MD: No ref. provider found   No chief complaint on file. Ischemic Heart Disease/CVA  History of Present Illness:    Timothy Yu is a 72 y.o. male with a hx of BMS mid-RCA for 95% lesion 06/24/2009, myoview 2014 noted to have inferior scar with mild per-infarct ischemia, normal EF. Last saw Dr. Stanford Breed 2012. CKD stage 3.  Noted to have had a CVA as being the referral reason. He states he had a stroke in [place] in April. I don't have any records. He said he had an echo and it was normal. He was on DAPT. Blood pressures are in the 140s He walks everyday. He gets shortness of breath sometimes. No chest pressure  Past Medical History:  Diagnosis Date   BPH (benign prostatic hyperplasia)    On Flomax   CKD (chronic kidney disease) stage 3, GFR 30-59 ml/min ([facility]) 2013   On ARB   Coronary artery disease 06/2009   Cath 06/2009 : bare metal stent to mid RCA 2/2 severe occlusion. Mod non-obstructing dz LAD & circ. On BB, statin, and ASA.;  ETT-Myoview (12/14):  Low risk, inf scar with mild peri-infarct ischemia; EF 55%   DM (diabetes mellitus) type II controlled, neurological manifestation ([facility])    diagnosis 2007, oral tx only. Has peripheral neuropathy   GERD (gastroesophageal reflux disease)    On PPI   Hyperlipidemia    Well controlled on statin   Hypertension    Well controlled on HCTZ, ARB, and BB   Insomnia    Trial of elavil 2013   Neuropathic pain of both legs    Started 2012 when he got steroid shot for L lumbar pain and started as needle was withdrawn. Intolerant of Gabapentin. On Elavil    Past Surgical History:  Procedure Laterality Date   UPPER GASTROINTESTINAL ENDOSCOPY  12/21/2022    Current Medications: No outpatient medications have been marked as  taking for the 02/12/23 encounter (Appointment) with Janina Mayo, MD.   Current Facility-Administered Medications for the 02/12/23 encounter (Appointment) with Janina Mayo, MD  Medication   0.9 %  sodium chloride infusion     Allergies:   Lisinopril, Gabapentin, and Metformin and related   Social History   Socioeconomic History   Marital status: Divorced    Spouse name: Not on file   Number of children: Not on file   Years of education: 12   Highest education level: Not on file  Occupational History   Occupation: unemployed  Tobacco Use   Smoking status: Some Days    Packs/day: 0.10    Types: Cigarettes   Smokeless tobacco: Not on file   Tobacco comments:    Pt trying to slowly decrease .  1 cig per day  Substance and Sexual Activity   Alcohol use: Yes   Drug use: No   Sexual activity: Not on file    Comment: disabled, single, from [place]  Other Topics Concern   Not on file  Social History Narrative   Patient gets regular exercise.   Caffeine use: occasionally   Social Determinants of Health   Financial Resource Strain: Not on file  Food Insecurity: Not on file  Transportation Needs: Not on file  Physical Activity: Not  on file  Stress: Not on file  Social Connections: Not on file     Family History: The patient's family history includes Diabetes in his mother; Heart attack (age of onset: 59) in his sister; Throat cancer in his paternal grandfather; Ulcers in his father. There is no history of Colon cancer.  ROS:   Please see the history of present illness.     All other systems reviewed and are negative.  EKGs/Labs/Other Studies Reviewed:    The following studies were reviewed today:   EKG:  EKG is  ordered today.  The ekg ordered today demonstrates   08/14/2022- NSR, LV[facility]  Recent Labs: No results found for requested labs within last 365 days.  Recent Lipid Panel    Component Value Date/Time   CHOL 145 12/09/2014 1121   TRIG 116 12/09/2014 1121    HDL 45 12/09/2014 1121   CHOLHDL 3.2 12/09/2014 1121   VLDL 23 12/09/2014 1121   LDLCALC 77 12/09/2014 1121     Risk Assessment/Calculations:     Physical Exam:    VS:   There were no vitals filed for this visit.    Wt Readings from Last 3 Encounters:  12/21/22 179 lb (81.2 kg)  12/18/22 179 lb (81.2 kg)  08/14/22 168 lb 12.8 oz (76.6 kg)     GEN:  Well nourished, well developed in no acute distress HEENT: Normal NECK: No JVD; No carotid bruits LYMPHATICS: No lymphadenopathy CARDIAC: RRR, no murmurs, rubs, gallops RESPIRATORY:  Clear to auscultation without rales, wheezing or rhonchi  ABDOMEN: Soft, non-tender, non-distended MUSCULOSKELETAL:  No edema; No deformity  SKIN: Warm and dry NEUROLOGIC:  Alert and oriented x 3 PSYCHIATRIC:  Normal affect   ASSESSMENT:    Ischemic Heart Dx: asymptomatic.  -cont BB -continue asa 81 mg daily -continue simvastatin 40 mg daily  CVA: stop plavix. Continue asa. Continue statin. Noted to be related to elevated BP. I don't have the records of his w/u or diagnosis.  HTN: BP not quite at goal and CVA noted in the setting of HTN.  Hctz increase to 25. Continue bb. Continue  norvasc 10 mg daily  Smoking Cessation: smoking one cigarette per day   PLAN:    In order of problems listed above:  Lipid*** Follow up 12 months      Medication Adjustments/Labs and Tests Ordered: Current medicines are reviewed at length with the patient today.  Concerns regarding medicines are outlined above.  No orders of the defined types were placed in this encounter.  No orders of the defined types were placed in this encounter.   There are no Patient Instructions on file for this visit.   Signed, Janina Mayo, MD  02/12/2023 2:08 PM    [place]

## 2023-03-04 ENCOUNTER — Ambulatory Visit (HOSPITAL_COMMUNITY): Admission: RE | Admit: 2023-03-04 | Payer: Medicare Other | Source: Ambulatory Visit
